# Patient Record
Sex: Female | Born: 1955 | Hispanic: No | Marital: Single | Smoking: Never smoker
Health system: Southern US, Community
[De-identification: ages and names within clinical notes are randomized; demographics above are authoritative.]

## PROBLEM LIST (undated history)

## (undated) DIAGNOSIS — I10 Essential (primary) hypertension: Secondary | ICD-10-CM

---

## 2008-01-19 ENCOUNTER — Ambulatory Visit: Payer: Self-pay | Admitting: Internal Medicine

## 2008-01-19 DIAGNOSIS — R109 Unspecified abdominal pain: Secondary | ICD-10-CM | POA: Insufficient documentation

## 2008-01-19 DIAGNOSIS — I10 Essential (primary) hypertension: Secondary | ICD-10-CM | POA: Insufficient documentation

## 2008-01-19 DIAGNOSIS — L659 Nonscarring hair loss, unspecified: Secondary | ICD-10-CM | POA: Insufficient documentation

## 2008-01-19 LAB — CONVERTED CEMR LAB
Bilirubin Urine: NEGATIVE
Blood in Urine, dipstick: NEGATIVE
Glucose, Urine, Semiquant: NEGATIVE
Nitrite: NEGATIVE
Protein, U semiquant: NEGATIVE
Specific Gravity, Urine: 1.02
Urobilinogen, UA: 0.2
pH: 6.5

## 2008-01-27 ENCOUNTER — Encounter (INDEPENDENT_AMBULATORY_CARE_PROVIDER_SITE_OTHER): Payer: Self-pay | Admitting: *Deleted

## 2008-01-27 LAB — CONVERTED CEMR LAB
BUN: 14 mg/dL (ref 6–23)
CO2: 24 meq/L (ref 19–32)
Calcium: 9.6 mg/dL (ref 8.4–10.5)
Chloride: 104 meq/L (ref 96–112)
Creatinine, Ser: 0.81 mg/dL (ref 0.40–1.20)
Glucose, Bld: 76 mg/dL (ref 70–99)
Potassium: 4.4 meq/L (ref 3.5–5.3)
Sodium: 141 meq/L (ref 135–145)
TSH: 0.783 microintl units/mL (ref 0.350–5.50)

## 2008-12-19 ENCOUNTER — Emergency Department (HOSPITAL_COMMUNITY)
Admission: EM | Admit: 2008-12-19 | Discharge: 2008-12-19 | Payer: BLUE CROSS/BLUE SHIELD | Admitting: Emergency Medicine

## 2010-08-17 ENCOUNTER — Emergency Department (HOSPITAL_COMMUNITY)
Admission: EM | Admit: 2010-08-17 | Discharge: 2010-08-17 | Payer: Self-pay | Source: Home / Self Care | Admitting: Emergency Medicine

## 2010-08-17 LAB — CBC
HCT: 37.1 % (ref 36.0–46.0)
Hemoglobin: 12.1 g/dL (ref 12.0–15.0)
MCH: 27 pg (ref 26.0–34.0)
MCHC: 32.6 g/dL (ref 30.0–36.0)
MCV: 82.8 fL (ref 78.0–100.0)
Platelets: 264 10*3/uL (ref 150–400)
RBC: 4.48 MIL/uL (ref 3.87–5.11)
RDW: 12.9 % (ref 11.5–15.5)
WBC: 9.9 10*3/uL (ref 4.0–10.5)

## 2010-08-17 LAB — COMPREHENSIVE METABOLIC PANEL
ALT: 12 U/L (ref 0–35)
AST: 17 U/L (ref 0–37)
Albumin: 3.9 g/dL (ref 3.5–5.2)
Alkaline Phosphatase: 73 U/L (ref 39–117)
BUN: 11 mg/dL (ref 6–23)
CO2: 29 mEq/L (ref 19–32)
Calcium: 9.7 mg/dL (ref 8.4–10.5)
Chloride: 105 mEq/L (ref 96–112)
Creatinine, Ser: 1 mg/dL (ref 0.4–1.2)
GFR calc Af Amer: >60 mL/min (ref >60)
GFR calc non Af Amer: 58 mL/min — ABNORMAL LOW (ref >60)
Glucose, Bld: 123 mg/dL — ABNORMAL HIGH (ref 70–99)
Potassium: 3.8 mEq/L (ref 3.5–5.1)
Sodium: 141 mEq/L (ref 135–145)
Total Bilirubin: 0.8 mg/dL (ref 0.3–1.2)
Total Protein: 7.5 g/dL (ref 6.0–8.3)

## 2010-08-17 LAB — URINALYSIS, ROUTINE W REFLEX MICROSCOPIC
Bilirubin Urine: NEGATIVE
Hemoglobin, Urine: NEGATIVE
Ketones, ur: NEGATIVE mg/dL
Nitrite: NEGATIVE
Protein, ur: NEGATIVE mg/dL
Specific Gravity, Urine: 1.014 (ref 1.005–1.030)
Urine Glucose, Fasting: NEGATIVE mg/dL
Urobilinogen, UA: 0.2 mg/dL (ref 0.0–1.0)
pH: 8 (ref 5.0–8.0)

## 2010-08-17 LAB — URINE MICROSCOPIC-ADD ON

## 2010-08-17 LAB — LIPASE, BLOOD: Lipase: 21 U/L (ref 11–59)

## 2010-09-11 NOTE — Letter (Signed)
Summary: *HSN Results Follow up  [facility]  [address] [place], [place] [address]   Phone: [phone]  Fax: [phone]      01/27/2008   Willella Bargar [address] [place], [place]  [address]   Dear  Ms. Takiya Brundidge,                            ____S.Drinkard,FNP   ____D. Gore,FNP       ____B. McPherson,MD   ____V. Rankins,MD    __xx__E. Mulberry,MD    ____N. Daphine Deutscher, FNP  ____D. Reche Dixon, MD    ____K. Philipp Deputy, MD    ____Other     This letter is to inform you that your recent test(s):  _______Pap Smear    ___x____Lab Test     _______X-ray    ___x____ is within acceptable limits  _______ requires a medication change  _______ requires a follow-up lab visit  _______ requires a follow-up visit with your provider   Comments:       _________________________________________________________ If you have any questions, please contact our office                     Sincerely,  Tiffany McCoy CMA [facility]

## 2010-09-11 NOTE — Assessment & Plan Note (Signed)
Summary: NEW PT/ FIRST EST CARE/ BP/ OUT OF PRENSCRIPTIONS//GK   Vital Signs:  Patient Profile:   55 Years Old Female Weight:      133.5 pounds Temp:     97.4 degrees F Pulse rate:   72 / minute Pulse rhythm:   regular Resp:     16 per minute BP sitting:   138 / 90  (left arm) Cuff size:   regular  Pt. in pain?   no  Vitals Entered By: Vesta Mixer CMA (January 19, 2008 10:06 AM)              Is Patient Diabetic? No  Does patient need assistance? Ambulation Normal Comments h/o total hyst.     Chief Complaint:  NP-HTN , needs refills on her meds, recently moved her and no doctors visit in 8 months, and she will soon have insurance through her job.Marland Kitchen  History of Present Illness: 55 yo female here to establish for short period--will have insurance in short while.  Concerns: 1.  Hair loss. 2.  right flank tenderness--see PE for history  PMH: 1.  Hypertension:  Previously on Lisinopril 40 mg and HCTZ 25 mg.  Has been out of meds just recently for HCTZ--still on the Lisinopril.   2.  Hx of frequent UTIs.  PSH: 1.  1999:  TAH and BSO for heavy bleeding. 2.  2000:  Kidney stones:  stone removal with uretostomy for a bit on the right      Prior Medications Reviewed Using: Medication Bottles  Current Allergies: No known allergies    Family History:    Mother, 62:  Lung cancer with bony Metastases.  Hypertension.  Cataracts.    Father, 77:  Hypercholesterolemia. Cataracts.  Pacemaker.    2 Sisters:  Hypertension.    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    Daughter lives here in [place] as well    Pt. to be driving a bus soon.   Risk Factors:  Tobacco use:  never Drug use:  yes    Substance:  cocaine    Comments:  used 6 months 19 years ago--never again.  No IV use. Alcohol use:  yes    Type:  rare Exercise:   yes    Times per week:  1    Type:  Wii Seatbelt use:  100 %    Physical Exam  General:     NAD Eyes:     No corneal or conjunctival inflammation noted. EOMI. Perrla. Funduscopic exam benign, without hemorrhages, exudates or papilledema. Vision grossly normal. Neck:     No deformities, masses, or tenderness noted. Lungs:     Normal respiratory effort, chest expands symmetrically. Lungs are clear to auscultation, no crackles or wheezes. Heart:     Normal rate and regular rhythm. S1 and S2 normal without gallop, murmur, click, rub or other extra sounds. Abdomen:     Bowel sounds positive,abdomen soft and non-tender without masses, organomegaly or hernias noted.  No abdominal bruits.  Pt. points to low right flank area of pain and tenderness at times--very superficial and quite specific area.  No mass felt--really only palpating soft tissue overlying musculature there. Pulses:     Normal and equal carotid and radial pulses. No carotid bruits.    Impression &  Recommendations:  Problem # 1:  ESSENTIAL HYPERTENSION (ICD-401.9)  Her updated medication list for this problem includes:    Lisinopril 20 Mg Tabs (Lisinopril) .Marland Kitchen... 2 by mouth once daily    Hydrochlorothiazide 25 Mg Tabs (Hydrochlorothiazide) .Marland Kitchen... 1 by mouth once daily  Orders: T-Basic Metabolic Panel [id number])   Problem # 2:  HAIR LOSS (ICD-704.00)  Orders: T-TSH ([id number])   Problem # 3:  FLANK PAIN, RIGHT (ICD-789.09) Suspect muscular pain--mild Orders: UA Dipstick w/o Micro (manual) (29562)   Complete Medication List: 1)  Lisinopril 20 Mg Tabs (Lisinopril) .... 2 by mouth once daily 2)  Hydrochlorothiazide 25 Mg Tabs (Hydrochlorothiazide) .Marland Kitchen.. 1 by mouth once daily    Prescriptions: HYDROCHLOROTHIAZIDE 25 MG  TABS (HYDROCHLOROTHIAZIDE) 1 by mouth once daily  #30 x 4   Entered and Authorized by:   Julieanne Manson MD   Signed by:   Julieanne Manson MD on 01/19/2008   Method used:   Print  then Give to Patient   RxID:   [id number] LISINOPRIL 20 MG  TABS (LISINOPRIL) 2 by mouth once daily  #60 x 4   Entered and Authorized by:   Julieanne Manson MD   Signed by:   Julieanne Manson MD on 01/19/2008   Method used:   Print then Give to Patient   RxID:   [id number]  ] Laboratory Results   Urine Tests    Routine Urinalysis   Glucose: negative   (Normal Range: Negative) Bilirubin: negative   (Normal Range: Negative) Ketone: trace (5)   (Normal Range: Negative) Spec. Gravity: 1.020   (Normal Range: 1.003-1.035) Blood: negative   (Normal Range: Negative) pH: 6.5   (Normal Range: 5.0-8.0) Protein: negative   (Normal Range: Negative) Urobilinogen: 0.2   (Normal Range: 0-1) Nitrite: negative   (Normal Range: Negative) Leukocyte Esterace: trace   (Normal Range: Negative)        Appended Document: NEW PT/ FIRST EST CARE/ BP/ OUT OF PRENSCRIPTIONS//GK call normal labs

## 2010-11-20 LAB — DIFFERENTIAL
Basophils Absolute: 0 10*3/uL (ref 0.0–0.1)
Basophils Relative: 0 % (ref 0–1)
Eosinophils Absolute: 0.2 10*3/uL (ref 0.0–0.7)
Eosinophils Relative: 2 % (ref 0–5)
Lymphocytes Relative: 21 % (ref 12–46)
Lymphs Abs: 1.6 10*3/uL (ref 0.7–4.0)
Monocytes Absolute: 0.3 10*3/uL (ref 0.1–1.0)
Monocytes Relative: 3 % (ref 3–12)
Neutro Abs: 5.8 10*3/uL (ref 1.7–7.7)
Neutrophils Relative %: 74 % (ref 43–77)

## 2010-11-20 LAB — CBC
HCT: 37.4 % (ref 36.0–46.0)
Hemoglobin: 12.5 g/dL (ref 12.0–15.0)
MCHC: 33.4 g/dL (ref 30.0–36.0)
MCV: 81.5 fL (ref 78.0–100.0)
Platelets: 261 10*3/uL (ref 150–400)
RBC: 4.59 MIL/uL (ref 3.87–5.11)
RDW: 13.6 % (ref 11.5–15.5)
WBC: 7.9 10*3/uL (ref 4.0–10.5)

## 2010-11-20 LAB — URINE MICROSCOPIC-ADD ON

## 2010-11-20 LAB — LIPASE, BLOOD: Lipase: 24 U/L (ref 11–59)

## 2010-11-20 LAB — URINALYSIS, ROUTINE W REFLEX MICROSCOPIC
Bilirubin Urine: NEGATIVE
Glucose, UA: NEGATIVE mg/dL
Ketones, ur: NEGATIVE mg/dL
Leukocytes, UA: NEGATIVE
Nitrite: NEGATIVE
Protein, ur: NEGATIVE mg/dL
Specific Gravity, Urine: 1.015 (ref 1.005–1.030)
Urobilinogen, UA: 0.2 mg/dL (ref 0.0–1.0)
pH: 7.5 (ref 5.0–8.0)

## 2010-11-20 LAB — COMPREHENSIVE METABOLIC PANEL
ALT: <8 U/L (ref 0–35)
AST: 23 U/L (ref 0–37)
Albumin: 4.1 g/dL (ref 3.5–5.2)
Alkaline Phosphatase: 82 U/L (ref 39–117)
BUN: 20 mg/dL (ref 6–23)
CO2: 24 mEq/L (ref 19–32)
Calcium: 9.5 mg/dL (ref 8.4–10.5)
Chloride: 106 mEq/L (ref 96–112)
Creatinine, Ser: 0.98 mg/dL (ref 0.4–1.2)
GFR calc Af Amer: >60 mL/min (ref >60)
GFR calc non Af Amer: 60 mL/min — ABNORMAL LOW (ref >60)
Glucose, Bld: 139 mg/dL — ABNORMAL HIGH (ref 70–99)
Potassium: 4.2 mEq/L (ref 3.5–5.1)
Sodium: 139 mEq/L (ref 135–145)
Total Bilirubin: 0.8 mg/dL (ref 0.3–1.2)
Total Protein: 8.1 g/dL (ref 6.0–8.3)

## 2012-04-24 ENCOUNTER — Other Ambulatory Visit: Payer: Self-pay | Admitting: Obstetrics and Gynecology

## 2012-04-24 DIAGNOSIS — Z1231 Encounter for screening mammogram for malignant neoplasm of breast: Secondary | ICD-10-CM

## 2012-05-14 ENCOUNTER — Ambulatory Visit
Admission: RE | Admit: 2012-05-14 | Discharge: 2012-05-14 | Disposition: A | Discharge status: Home / Self Care | Payer: BC Managed Care – PPO | Source: Ambulatory Visit | Attending: Obstetrics and Gynecology | Admitting: Obstetrics and Gynecology

## 2012-05-14 DIAGNOSIS — Z1231 Encounter for screening mammogram for malignant neoplasm of breast: Secondary | ICD-10-CM

## 2015-01-05 ENCOUNTER — Other Ambulatory Visit: Payer: Self-pay | Admitting: Family Medicine

## 2015-01-05 DIAGNOSIS — Z1231 Encounter for screening mammogram for malignant neoplasm of breast: Secondary | ICD-10-CM

## 2015-01-12 ENCOUNTER — Ambulatory Visit
Admission: RE | Admit: 2015-01-12 | Discharge: 2015-01-12 | Disposition: A | Discharge status: Home / Self Care | Payer: BLUE CROSS/BLUE SHIELD | Source: Ambulatory Visit | Attending: Family Medicine | Admitting: Family Medicine

## 2015-01-12 DIAGNOSIS — Z1231 Encounter for screening mammogram for malignant neoplasm of breast: Secondary | ICD-10-CM

## 2015-10-21 ENCOUNTER — Emergency Department (HOSPITAL_COMMUNITY): Payer: BLUE CROSS/BLUE SHIELD

## 2015-10-21 ENCOUNTER — Emergency Department (HOSPITAL_COMMUNITY)
Admission: EM | Admit: 2015-10-21 | Discharge: 2015-10-21 | Disposition: A | Discharge status: Home / Self Care | Payer: BLUE CROSS/BLUE SHIELD | Attending: Emergency Medicine | Admitting: Emergency Medicine

## 2015-10-21 ENCOUNTER — Encounter (HOSPITAL_COMMUNITY): Payer: Self-pay | Admitting: *Deleted

## 2015-10-21 DIAGNOSIS — R079 Chest pain, unspecified: Secondary | ICD-10-CM | POA: Insufficient documentation

## 2015-10-21 DIAGNOSIS — I10 Essential (primary) hypertension: Secondary | ICD-10-CM | POA: Insufficient documentation

## 2015-10-21 HISTORY — DX: Essential (primary) hypertension: I10

## 2015-10-21 LAB — CBC
HCT: 37.1 % (ref 36.0–46.0)
Hemoglobin: 12 g/dL (ref 12.0–15.0)
MCH: 27 pg (ref 26.0–34.0)
MCHC: 32.3 g/dL (ref 30.0–36.0)
MCV: 83.4 fL (ref 78.0–100.0)
Platelets: 300 10*3/uL (ref 150–400)
RBC: 4.45 MIL/uL (ref 3.87–5.11)
RDW: 13.2 % (ref 11.5–15.5)
WBC: 8.2 10*3/uL (ref 4.0–10.5)

## 2015-10-21 LAB — BASIC METABOLIC PANEL
Anion gap: 9 (ref 5–15)
BUN: 7 mg/dL (ref 6–20)
CO2: 27 mmol/L (ref 22–32)
Calcium: 9.4 mg/dL (ref 8.9–10.3)
Chloride: 105 mmol/L (ref 101–111)
Creatinine, Ser: 0.85 mg/dL (ref 0.44–1.00)
GFR calc Af Amer: >60 mL/min (ref >60)
GFR calc non Af Amer: >60 mL/min (ref >60)
Glucose, Bld: 118 mg/dL — ABNORMAL HIGH (ref 65–99)
Potassium: 3.9 mmol/L (ref 3.5–5.1)
Sodium: 141 mmol/L (ref 135–145)

## 2015-10-21 LAB — TROPONIN I: Troponin I: <0.03 ng/mL (ref <0.031)

## 2015-10-21 MED ORDER — GI COCKTAIL ~~LOC~~
30.0000 mL | Freq: Once | ORAL | Status: AC
Start: 2015-10-21 — End: 2015-10-21
  Administered 2015-10-21: 30 mL via ORAL
  Filled 2015-10-21: qty 30

## 2015-10-21 MED ORDER — OMEPRAZOLE 20 MG PO CPDR: 20.0000 mg | DELAYED_RELEASE_CAPSULE | Freq: Two times a day (BID) | ORAL | Status: DC

## 2015-10-21 NOTE — ED Notes (Signed)
The pt is c/o chest pain i triaged her up front no change in her chest pain

## 2015-10-21 NOTE — Discharge Instructions (Signed)
Nonspecific Chest Pain  °Chest pain can be caused by many different conditions. There is always a chance that your pain could be related to something serious, such as a heart attack or a blood clot in your lungs. Chest pain can also be caused by conditions that are not life-threatening. If you have chest pain, it is very important to follow up with your health care provider. °CAUSES  °Chest pain can be caused by: °· Heartburn. °· Pneumonia or bronchitis. °· Anxiety or stress. °· Inflammation around your heart (pericarditis) or lung (pleuritis or pleurisy). °· A blood clot in your lung. °· A collapsed lung (pneumothorax). It can develop suddenly on its own (spontaneous pneumothorax) or from trauma to the chest. °· Shingles infection (varicella-zoster virus). °· Heart attack. °· Damage to the bones, muscles, and cartilage that make up your chest wall. This can include: °· Bruised bones due to injury. °· Strained muscles or cartilage due to frequent or repeated coughing or overwork. °· Fracture to one or more ribs. °· Sore cartilage due to inflammation (costochondritis). °RISK FACTORS  °Risk factors for chest pain may include: °· Activities that increase your risk for trauma or injury to your chest. °· Respiratory infections or conditions that cause frequent coughing. °· Medical conditions or overeating that can cause heartburn. °· Heart disease or family history of heart disease. °· Conditions or health behaviors that increase your risk of developing a blood clot. °· Having had chicken pox (varicella zoster). °SIGNS AND SYMPTOMS °Chest pain can feel like: °· Burning or tingling on the surface of your chest or deep in your chest. °· Crushing, pressure, aching, or squeezing pain. °· Dull or sharp pain that is worse when you move, cough, or take a deep breath. °· Pain that is also felt in your back, neck, shoulder, or arm, or pain that spreads to any of these areas. °Your chest pain may come and go, or it may stay  constant. °DIAGNOSIS °Lab tests or other studies may be needed to find the cause of your pain. Your health care provider may have you take a test called an ambulatory ECG (electrocardiogram). An ECG records your heartbeat patterns at the time the test is performed. You may also have other tests, such as: °· Transthoracic echocardiogram (TTE). During echocardiography, sound waves are used to create a picture of all of the heart structures and to look at how blood flows through your heart. °· Transesophageal echocardiogram (TEE). This is a more advanced imaging test that obtains images from inside your body. It allows your health care provider to see your heart in finer detail. °· Cardiac monitoring. This allows your health care provider to monitor your heart rate and rhythm in real time. °· Holter monitor. This is a portable device that records your heartbeat and can help to diagnose abnormal heartbeats. It allows your health care provider to track your heart activity for several days, if needed. °· Stress tests. These can be done through exercise or by taking medicine that makes your heart beat more quickly. °· Blood tests. °· Imaging tests. °TREATMENT  °Your treatment depends on what is causing your chest pain. Treatment may include: °· Medicines. These may include: °· Acid blockers for heartburn. °· Anti-inflammatory medicine. °· Pain medicine for inflammatory conditions. °· Antibiotic medicine, if an infection is present. °· Medicines to dissolve blood clots. °· Medicines to treat coronary artery disease. °· Supportive care for conditions that do not require medicines. This may include: °· Resting. °· Applying heat   or cold packs to injured areas. °· Limiting activities until pain decreases. °HOME CARE INSTRUCTIONS °· If you were prescribed an antibiotic medicine, finish it all even if you start to feel better. °· Avoid any activities that bring on chest pain. °· Do not use any tobacco products, including  cigarettes, chewing tobacco, or electronic cigarettes. If you need help quitting, ask your health care provider. °· Do not drink alcohol. °· Take medicines only as directed by your health care provider. °· Keep all follow-up visits as directed by your health care provider. This is important. This includes any further testing if your chest pain does not go away. °· If heartburn is the cause for your chest pain, you may be told to keep your head raised (elevated) while sleeping. This reduces the chance that acid will go from your stomach into your esophagus. °· Make lifestyle changes as directed by your health care provider. These may include: °¨ Getting regular exercise. Ask your health care provider to suggest some activities that are safe for you. °¨ Eating a heart-healthy diet. A registered dietitian can help you to learn healthy eating options. °¨ Maintaining a healthy weight. °¨ Managing diabetes, if necessary. °¨ Reducing stress. °SEEK MEDICAL CARE IF: °· Your chest pain does not go away after treatment. °· You have a rash with blisters on your chest. °· You have a fever. °SEEK IMMEDIATE MEDICAL CARE IF:  °· Your chest pain is worse. °· You have an increasing cough, or you cough up blood. °· You have severe abdominal pain. °· You have severe weakness. °· You faint. °· You have chills. °· You have sudden, unexplained chest discomfort. °· You have sudden, unexplained discomfort in your arms, back, neck, or jaw. °· You have shortness of breath at any time. °· You suddenly start to sweat, or your skin gets clammy. °· You feel nauseous or you vomit. °· You suddenly feel light-headed or dizzy. °· Your heart begins to beat quickly, or it feels like it is skipping beats. °These symptoms may represent a serious problem that is an emergency. Do not wait to see if the symptoms will go away. Get medical help right away. Call your local emergency services (911 in the [place] Do not drive yourself to the hospital. °  °This  information is not intended to replace advice given to you by your health care provider. Make sure you discuss any questions you have with your health care provider. °  °Document Released: 05/08/2005 Document Revised: 08/19/2014 Document Reviewed: 03/04/2014 °Elsevier Interactive Patient Education ©2016 [employer] ° °Gastroesophageal Reflux Disease, Adult °Normally, food travels down the esophagus and stays in the stomach to be digested. However, when a person has gastroesophageal reflux disease (GERD), food and stomach acid move back up into the esophagus. When this happens, the esophagus becomes sore and inflamed. Over time, GERD can create small holes (ulcers) in the lining of the esophagus.  °CAUSES °This condition is caused by a problem with the muscle between the esophagus and the stomach (lower esophageal sphincter, or LES). Normally, the LES muscle closes after food passes through the esophagus to the stomach. When the LES is weakened or abnormal, it does not close properly, and that allows food and stomach acid to go back up into the esophagus. The LES can be weakened by certain dietary substances, medicines, and medical conditions, including: °· Tobacco use. °· Pregnancy. °· Having a hiatal hernia. °· Heavy alcohol use. °· Certain foods and beverages, such as coffee,   chocolate, onions, and peppermint. °RISK FACTORS °This condition is more likely to develop in: °· People who have an increased body weight. °· People who have connective tissue disorders. °· People who use NSAID medicines. °SYMPTOMS °Symptoms of this condition include: °· Heartburn. °· Difficult or painful swallowing. °· The feeling of having a lump in the throat. °· A bitter taste in the mouth. °· Bad breath. °· Having a large amount of saliva. °· Having an upset or bloated stomach. °· Belching. °· Chest pain. °· Shortness of breath or wheezing. °· Ongoing (chronic) cough or a night-time cough. °· Wearing away of tooth enamel. °· Weight  loss. °Different conditions can cause chest pain. Make sure to see your health care provider if you experience chest pain. °DIAGNOSIS °Your health care provider will take a medical history and perform a physical exam. To determine if you have mild or severe GERD, your health care provider may also monitor how you respond to treatment. You may also have other tests, including: °· An endoscopy to examine your stomach and esophagus with a small camera. °· A test that measures the acidity level in your esophagus. °· A test that measures how much pressure is on your esophagus. °· A barium swallow or modified barium swallow to show the shape, size, and functioning of your esophagus. °TREATMENT °The goal of treatment is to help relieve your symptoms and to prevent complications. Treatment for this condition may vary depending on how severe your symptoms are. Your health care provider may recommend: °· Changes to your diet. °· Medicine. °· Surgery. °HOME CARE INSTRUCTIONS °Diet °· Follow a diet as recommended by your health care provider. This may involve avoiding foods and drinks such as: °¨ Coffee and tea (with or without caffeine). °¨ Drinks that contain alcohol. °¨ Energy drinks and sports drinks. °¨ Carbonated drinks or sodas. °¨ Chocolate and cocoa. °¨ Peppermint and mint flavorings. °¨ Garlic and onions. °¨ Horseradish. °¨ Spicy and acidic foods, including peppers, chili powder, curry powder, vinegar, hot sauces, and barbecue sauce. °¨ Citrus fruit juices and citrus fruits, such as oranges, lemons, and limes. °¨ Tomato-based foods, such as red sauce, chili, salsa, and pizza with red sauce. °¨ Fried and fatty foods, such as donuts, french fries, potato chips, and high-fat dressings. °¨ High-fat meats, such as hot dogs and fatty cuts of red and white meats, such as rib eye steak, sausage, ham, and bacon. °¨ High-fat dairy items, such as whole milk, butter, and cream cheese. °· Eat small, frequent meals instead of large  meals. °· Avoid drinking large amounts of liquid with your meals. °· Avoid eating meals during the 2-3 hours before bedtime. °· Avoid lying down right after you eat. °· Do not exercise right after you eat. ° General Instructions  °· Pay attention to any changes in your symptoms. °· Take over-the-counter and prescription medicines only as told by your health care provider. Do not take aspirin, ibuprofen, or other NSAIDs unless your health care provider told you to do so. °· Do not use any tobacco products, including cigarettes, chewing tobacco, and e-cigarettes. If you need help quitting, ask your health care provider. °· Wear loose-fitting clothing. Do not wear anything tight around your waist that causes pressure on your abdomen. °· Raise (elevate) the head of your bed 6 inches (15cm). °· Try to reduce your stress, such as with yoga or meditation. If you need help reducing stress, ask your health care provider. °· If you are overweight, reduce your weight to an amount that is   healthy for you. Ask your health care provider for guidance about a safe weight loss goal. °· Keep all follow-up visits as told by your health care provider. This is important. °SEEK MEDICAL CARE IF: °· You have new symptoms. °· You have unexplained weight loss. °· You have difficulty swallowing, or it hurts to swallow. °· You have wheezing or a persistent cough. °· Your symptoms do not improve with treatment. °· You have a hoarse voice. °SEEK IMMEDIATE MEDICAL CARE IF: °· You have pain in your arms, neck, jaw, teeth, or back. °· You feel sweaty, dizzy, or light-headed. °· You have chest pain or shortness of breath. °· You vomit and your vomit looks like blood or coffee grounds. °· You faint. °· Your stool is bloody or black. °· You cannot swallow, drink, or eat. °  °This information is not intended to replace advice given to you by your health care provider. Make sure you discuss any questions you have with your health care provider. °    °Document Released: 05/08/2005 Document Revised: 04/19/2015 Document Reviewed: 11/23/2014 °Elsevier Interactive Patient Education ©2016 Elsevier Inc. ° °

## 2015-10-21 NOTE — ED Provider Notes (Signed)
CSN: [id number]     Arrival date & time 10/21/15  0026 History  By signing my name below, I, Doreatha Martin, attest that this documentation has been prepared under the direction and in the presence of Azalia Bilis, MD. Electronically Signed: Doreatha Martin, ED Scribe. 10/21/2015. 3:51 AM.    Chief Complaint  Patient presents with  . Chest Pain   The history is provided by the patient. No language interpreter was used.   HPI Comments: Melissa Hays is a 60 y.o. female with h/o HTN who presents to the Emergency Department complaining of moderate, persistent, constant CP and pressure onset this morning. Pt states associated shoulder pain onset this morning, neck pain, nausea, dorsal throat pain. She states she took her BP medications as scheduled this morning before the onset of pain. Pt states her pain persisted until 1 pm when she called her doctor and persisted again until 3 pm. She states that she then took ASA and used a heating pad with no relief of pain. She reports that deep breathing exacerbates her chest and throat pain. Pt notes that her nausea has currently subsided. No h/o HLD, DM. Pt is a non-smoker. No FHx heart disease. No FHx of CAD/CHF<55yo. PCP is with [facility]. She denies emesis, diarrhea.   Past Medical History  Diagnosis Date  . Hypertension    History reviewed. No pertinent past surgical history. No family history on file. Social History  Substance Use Topics  . Smoking status: Never Smoker   . Smokeless tobacco: None  . Alcohol Use: Yes   OB History    No data available     Review of Systems A complete 10 system review of systems was obtained and all systems are negative except as noted in the HPI and PMH.    Allergies  Review of patient's allergies indicates no known allergies.  Home Medications   Prior to Admission medications   Not on File   BP 133/101 mmHg  Pulse 88  Temp(Src) 98.8 F (37.1 C) (Oral)  Resp 20  Ht 5' (1.524 m)  Wt 149 lb 7 oz  (67.784 kg)  BMI 29.18 kg/m2  SpO2 97% Physical Exam  Constitutional: She is oriented to person, place, and time. She appears well-developed and well-nourished. No distress.  HENT:  Head: Normocephalic and atraumatic.  Eyes: EOM are normal.  Neck: Normal range of motion.  Cardiovascular: Normal rate, regular rhythm and normal heart sounds.  Exam reveals no gallop and no friction rub.   No murmur heard. Pulmonary/Chest: Effort normal and breath sounds normal. No respiratory distress. She has no wheezes. She has no rales.  Abdominal: Soft. She exhibits no distension. There is no tenderness.  Musculoskeletal: Normal range of motion.  Neurological: She is alert and oriented to person, place, and time.  Skin: Skin is warm and dry.  Psychiatric: She has a normal mood and affect. Judgment normal.  Nursing note and vitals reviewed.   ED Course  Procedures (including critical care time) DIAGNOSTIC STUDIES: Oxygen Saturation is 97% on RA, normal by my interpretation.    COORDINATION OF CARE: 3:49 AM Discussed treatment plan with pt at bedside which includes lab work, CXR and pt agreed to plan.   Labs Review Labs Reviewed  BASIC METABOLIC PANEL - Abnormal; Notable for the following:    Glucose, Bld 118 (*)    All other components within normal limits  CBC  TROPONIN I    Imaging Review Dg Chest 2 View  10/21/2015  CLINICAL DATA:  Substernal chest pain EXAM: CHEST  2 VIEW COMPARISON:  None. FINDINGS: Normal heart size and mediastinal contours. Minimal atelectasis. No acute infiltrate or edema. No effusion or pneumothorax. At least moderate mid thoracic compression deformity with margins poorly evaluated due to overlapping tissues. Fracture is age indeterminate by imaging. IMPRESSION: 1. Minimal atelectasis.  No pneumonia or edema. 2. Mid thoracic compression fracture. Electronically Signed   By: Marnee SpringJonathon  Watts M.D.   On: 10/21/2015 02:04   I have personally reviewed and evaluated these  images and lab results as part of my medical decision-making.   EKG Interpretation   Date/Time:  Saturday October 21 2015 00:30:29 EST Ventricular Rate:  90 PR Interval:  194 QRS Duration: 84 QT Interval:  322 QTC Calculation: 393 R Axis:   39 Text Interpretation:  Normal sinus rhythm Nonspecific T wave abnormality  Abnormal ECG No old tracing to compare Confirmed by Hasana Alcorta  MD, Jacorian Golaszewski  ([id number]) on 10/21/2015 3:49:04 AM      MDM   Final diagnoses:  Chest pain, unspecified chest pain type    Chest pain.  Doubt ACS.  Constant pain.  EKG without ischemic changes.  Troponin negative.  Doubt PE.  Could represent pericarditis versus Esophageal reflux disease.  Improved with GI cocktail.  Discharge home with anti-inflammatories and Prilosec.  Primary care and GI follow-up.  She understands to return to the ER for new or worsening symptoms  I personally performed the services described in this documentation, which was scribed in my presence. The recorded information has been reviewed and is accurate.       Azalia BilisKevin Zanyia Silbaugh, MD 10/21/15 [phone]

## 2015-10-21 NOTE — ED Notes (Signed)
The pt is c/o mid chest pain since this am with some sl sob  None now.  Describes it as a heaviness.

## 2016-01-13 ENCOUNTER — Emergency Department (HOSPITAL_COMMUNITY)
Admission: EM | Admit: 2016-01-13 | Discharge: 2016-01-13 | Disposition: A | Discharge status: Home / Self Care | Payer: BLUE CROSS/BLUE SHIELD | Attending: Emergency Medicine | Admitting: Emergency Medicine

## 2016-01-13 ENCOUNTER — Encounter (HOSPITAL_COMMUNITY): Payer: Self-pay | Admitting: Emergency Medicine

## 2016-01-13 DIAGNOSIS — Y999 Unspecified external cause status: Secondary | ICD-10-CM | POA: Diagnosis not present

## 2016-01-13 DIAGNOSIS — I10 Essential (primary) hypertension: Secondary | ICD-10-CM | POA: Diagnosis not present

## 2016-01-13 DIAGNOSIS — Y9289 Other specified places as the place of occurrence of the external cause: Secondary | ICD-10-CM | POA: Insufficient documentation

## 2016-01-13 DIAGNOSIS — S59902A Unspecified injury of left elbow, initial encounter: Secondary | ICD-10-CM | POA: Diagnosis not present

## 2016-01-13 DIAGNOSIS — W010XXA Fall on same level from slipping, tripping and stumbling without subsequent striking against object, initial encounter: Secondary | ICD-10-CM | POA: Insufficient documentation

## 2016-01-13 DIAGNOSIS — Y939 Activity, unspecified: Secondary | ICD-10-CM | POA: Insufficient documentation

## 2016-01-13 DIAGNOSIS — S6991XA Unspecified injury of right wrist, hand and finger(s), initial encounter: Secondary | ICD-10-CM | POA: Diagnosis not present

## 2016-01-13 DIAGNOSIS — W19XXXA Unspecified fall, initial encounter: Secondary | ICD-10-CM

## 2016-01-13 DIAGNOSIS — M7918 Myalgia, other site: Secondary | ICD-10-CM

## 2016-01-13 NOTE — Discharge Instructions (Signed)
Read the information below.  You may return to the Emergency Department at any time for worsening condition or any new symptoms that concern you.  If you develop uncontrolled pain, weakness or numbness of the extremity, or severe discoloration of the skin return to the ER for a recheck.      Musculoskeletal Pain Musculoskeletal pain is muscle and boney aches and pains. These pains can occur in any part of the body. Your caregiver may treat you without knowing the cause of the pain. They may treat you if blood or urine tests, X-rays, and other tests were normal.  CAUSES There is often not a definite cause or reason for these pains. These pains may be caused by a type of germ (virus). The discomfort may also come from overuse. Overuse includes working out too hard when your body is not fit. Boney aches also come from weather changes. Bone is sensitive to atmospheric pressure changes. HOME CARE INSTRUCTIONS   Ask when your test results will be ready. Make sure you get your test results.  Only take over-the-counter or prescription medicines for pain, discomfort, or fever as directed by your caregiver. If you were given medications for your condition, do not drive, operate machinery or power tools, or sign legal documents for 24 hours. Do not drink alcohol. Do not take sleeping pills or other medications that may interfere with treatment.  Continue all activities unless the activities cause more pain. When the pain lessens, slowly resume normal activities. Gradually increase the intensity and duration of the activities or exercise.  During periods of severe pain, bed rest may be helpful. Lay or sit in any position that is comfortable.  Putting ice on the injured area.  Put ice in a bag.  Place a towel between your skin and the bag.  Leave the ice on for 15 to 20 minutes, 3 to 4 times a day.  Follow up with your caregiver for continued problems and no reason can be found for the pain. If the pain  becomes worse or does not go away, it may be necessary to repeat tests or do additional testing. Your caregiver may need to look further for a possible cause. SEEK IMMEDIATE MEDICAL CARE IF:  You have pain that is getting worse and is not relieved by medications.  You develop chest pain that is associated with shortness or breath, sweating, feeling sick to your stomach (nauseous), or throw up (vomit).  Your pain becomes localized to the abdomen.  You develop any new symptoms that seem different or that concern you. MAKE SURE YOU:   Understand these instructions.  Will watch your condition.  Will get help right away if you are not doing well or get worse.   This information is not intended to replace advice given to you by your health care provider. Make sure you discuss any questions you have with your health care provider.   Document Released: 07/29/2005 Document Revised: 10/21/2011 Document Reviewed: 04/02/2013 Elsevier Interactive Patient Education [employer]2016 [employer] Inc.

## 2016-01-13 NOTE — ED Provider Notes (Signed)
CSN: [id number]     Arrival date & time 01/13/16  1514 History  By signing my name below, I, Tanda Rockers, attest that this documentation has been prepared under the direction and in the presence of [facility], PA-C. Electronically Signed: Tanda Rockers, ED Scribe. 01/13/2016. 5:03 PM.   Chief Complaint  Patient presents with  . Fall   The history is provided by the patient. No language interpreter was used.     HPI Comments: Melissa Hays is a 60 y.o. female who presents to the Emergency Department complaining of gradual onset, constant, right palm pain and left elbow pain s/p fall that occurred earlier today. Pt was in [employer] when she slipped on water and fell backwards. Pt attempted to catch herself with her outstretched hands, causing pain to the palm of her right hand. She believes that she slapped her hand too hard onto the ground. Pt also attributes her elbow pain to bruising and does not feel like she needs x rays at this time. Her elbow hit a stack of bricks.  No head injury or LOC. Denies weakness, numbness, tingling, chest pain, shortness of breath, abdominal pain, leg pain, hip pain, headache, or any other associated symptoms.  Denies any other significant pain at this time.    Past Medical History  Diagnosis Date  . Hypertension    History reviewed. No pertinent past surgical history. No family history on file. Social History  Substance Use Topics  . Smoking status: Never Smoker   . Smokeless tobacco: None  . Alcohol Use: Yes   OB History    No data available     Review of Systems  Constitutional: Negative for diaphoresis, activity change and appetite change.  Respiratory: Negative for shortness of breath.   Cardiovascular: Negative for chest pain.  Gastrointestinal: Negative for abdominal pain.  Musculoskeletal: Positive for arthralgias. Negative for myalgias, back pain and neck pain.  Skin: Negative for wound.  Allergic/Immunologic: Negative for  immunocompromised state.  Neurological: Negative for syncope, weakness, numbness and headaches.  Hematological: Does not bruise/bleed easily.  Psychiatric/Behavioral: Negative for self-injury.   Allergies  Review of patient's allergies indicates no known allergies.  Home Medications   Prior to Admission medications   Medication Sig Start Date End Date Taking? Authorizing Provider  omeprazole (PRILOSEC) 20 MG capsule Take 1 capsule (20 mg total) by mouth 2 (two) times daily before a meal. 10/21/15   Azalia Bilis, MD   BP 120/83 mmHg  Pulse 72  Temp(Src) 98.4 F (36.9 C) (Oral)  Resp 16  Ht 5' (1.524 m)  Wt 145 lb (65.772 kg)  BMI 28.32 kg/m2  SpO2 98%   Physical Exam  Constitutional: She appears well-developed and well-nourished. No distress.  HENT:  Head: Normocephalic and atraumatic.  Neck: Neck supple.  Pulmonary/Chest: Effort normal.  Abdominal: Soft. There is no tenderness.  No focal tenderness on exam  Musculoskeletal:  Upper extremities:  Strength 5/5, sensation intact, distal pulses intact.   Spine nontender, no crepitus, or stepoffs.  Neurological: She is alert.  Skin: She is not diaphoretic.  Nursing note and vitals reviewed.   ED Course  Procedures (including critical care time)  DIAGNOSTIC STUDIES: Oxygen Saturation is 98% on RA, normal by my interpretation.    COORDINATION OF CARE: 5:02 PM-Discussed treatment plan with pt at bedside and pt agreed to plan.    MDM   Final diagnoses:  Fall, initial encounter  Musculoskeletal pain   Engaged in joint medical decision making with  the patient who declined x rays at this time.  Afebrile, nontoxic patient with injury to her right palm and left elbow from mechanical fall (slipped on water).   Xray not currently indicated.  NO focal tenderness.  Neurovascularly intact.   D/C home with PCP follow up PRN.  Discussed result, findings, treatment, and follow up  with patient.  Pt given return precautions.  Pt  verbalizes understanding and agrees with plan.       I personally performed the services described in this documentation, which was scribed in my presence. The recorded information has been reviewed and is accurate.     Trixie Dredgemily Giordana Weinheimer, PA-C 01/13/16 1814  Lorre NickAnthony Allen, MD 01/14/16 2253
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
## 2016-01-13 NOTE — ED Notes (Signed)
Pt reports left elbow, right hand, bilateral ribcage, right hip, and right leg pain post fall in [employer].

## 2016-07-18 ENCOUNTER — Encounter (HOSPITAL_COMMUNITY): Payer: Self-pay | Admitting: *Deleted

## 2016-07-18 ENCOUNTER — Emergency Department (HOSPITAL_COMMUNITY)
Admission: EM | Admit: 2016-07-18 | Discharge: 2016-07-18 | Disposition: A | Discharge status: Home / Self Care | Payer: BLUE CROSS/BLUE SHIELD | Attending: Emergency Medicine | Admitting: Emergency Medicine

## 2016-07-18 ENCOUNTER — Emergency Department (HOSPITAL_COMMUNITY): Payer: BLUE CROSS/BLUE SHIELD

## 2016-07-18 DIAGNOSIS — N23 Unspecified renal colic: Secondary | ICD-10-CM

## 2016-07-18 DIAGNOSIS — I10 Essential (primary) hypertension: Secondary | ICD-10-CM | POA: Insufficient documentation

## 2016-07-18 DIAGNOSIS — I7102 Dissection of abdominal aorta: Secondary | ICD-10-CM

## 2016-07-18 DIAGNOSIS — R350 Frequency of micturition: Secondary | ICD-10-CM | POA: Diagnosis present

## 2016-07-18 LAB — CBC WITH DIFFERENTIAL/PLATELET
Basophils Absolute: 0 10*3/uL (ref 0.0–0.1)
Basophils Relative: 1 %
Eosinophils Absolute: 0.2 10*3/uL (ref 0.0–0.7)
Eosinophils Relative: 3 %
HCT: 37.4 % (ref 36.0–46.0)
Hemoglobin: 12.1 g/dL (ref 12.0–15.0)
Lymphocytes Relative: 31 %
Lymphs Abs: 2 10*3/uL (ref 0.7–4.0)
MCH: 26.5 pg (ref 26.0–34.0)
MCHC: 32.4 g/dL (ref 30.0–36.0)
MCV: 82 fL (ref 78.0–100.0)
Monocytes Absolute: 0.3 10*3/uL (ref 0.1–1.0)
Monocytes Relative: 4 %
Neutro Abs: 4 10*3/uL (ref 1.7–7.7)
Neutrophils Relative %: 61 %
Platelets: 304 10*3/uL (ref 150–400)
RBC: 4.56 MIL/uL (ref 3.87–5.11)
RDW: 13.3 % (ref 11.5–15.5)
WBC: 6.4 10*3/uL (ref 4.0–10.5)

## 2016-07-18 LAB — COMPREHENSIVE METABOLIC PANEL
ALT: 12 U/L — ABNORMAL LOW (ref 14–54)
AST: 21 U/L (ref 15–41)
Albumin: 4.2 g/dL (ref 3.5–5.0)
Alkaline Phosphatase: 72 U/L (ref 38–126)
Anion gap: 10 (ref 5–15)
BUN: 10 mg/dL (ref 6–20)
CO2: 23 mmol/L (ref 22–32)
Calcium: 9.4 mg/dL (ref 8.9–10.3)
Chloride: 104 mmol/L (ref 101–111)
Creatinine, Ser: 0.98 mg/dL (ref 0.44–1.00)
GFR calc Af Amer: >60 mL/min (ref >60)
GFR calc non Af Amer: >60 mL/min (ref >60)
Glucose, Bld: 149 mg/dL — ABNORMAL HIGH (ref 65–99)
Potassium: 3.4 mmol/L — ABNORMAL LOW (ref 3.5–5.1)
Sodium: 137 mmol/L (ref 135–145)
Total Bilirubin: 0.7 mg/dL (ref 0.3–1.2)
Total Protein: 7.9 g/dL (ref 6.5–8.1)

## 2016-07-18 LAB — URINALYSIS, ROUTINE W REFLEX MICROSCOPIC
Bilirubin Urine: NEGATIVE
Glucose, UA: NEGATIVE mg/dL
Hgb urine dipstick: NEGATIVE
Ketones, ur: NEGATIVE mg/dL
Leukocytes, UA: NEGATIVE
Nitrite: NEGATIVE
Protein, ur: NEGATIVE mg/dL
Specific Gravity, Urine: 1.008 (ref 1.005–1.030)
pH: 9 — ABNORMAL HIGH (ref 5.0–8.0)

## 2016-07-18 LAB — WET PREP, GENITAL
Sperm: NONE SEEN
Trich, Wet Prep: NONE SEEN
Yeast Wet Prep HPF POC: NONE SEEN

## 2016-07-18 LAB — I-STAT CHEM 8, ED
BUN: 11 mg/dL (ref 6–20)
Calcium, Ion: 1.18 mmol/L (ref 1.15–1.40)
Chloride: 104 mmol/L (ref 101–111)
Creatinine, Ser: 0.8 mg/dL (ref 0.44–1.00)
Glucose, Bld: 151 mg/dL — ABNORMAL HIGH (ref 65–99)
HCT: 38 % (ref 36.0–46.0)
Hemoglobin: 12.9 g/dL (ref 12.0–15.0)
Potassium: 3.5 mmol/L (ref 3.5–5.1)
Sodium: 140 mmol/L (ref 135–145)
TCO2: 24 mmol/L (ref 0–100)

## 2016-07-18 LAB — POC URINE PREG, ED: Preg Test, Ur: NEGATIVE

## 2016-07-18 MED ORDER — IOPAMIDOL (ISOVUE-300) INJECTION 61%
INTRAVENOUS | Status: AC
  Administered 2016-07-18: 100 mL
  Filled 2016-07-18: qty 100

## 2016-07-18 MED ORDER — ONDANSETRON 4 MG PO TBDP: 4.0000 mg | ORAL_TABLET | Freq: Three times a day (TID) | ORAL | 0 refills | Status: DC | PRN

## 2016-07-18 MED ORDER — TRAMADOL HCL 50 MG PO TABS
50.0000 mg | ORAL_TABLET | Freq: Once | ORAL | Status: AC
  Administered 2016-07-18: 50 mg via ORAL
  Filled 2016-07-18: qty 1

## 2016-07-18 MED ORDER — SODIUM CHLORIDE 0.9 % IV BOLUS (SEPSIS)
1000.0000 mL | Freq: Once | INTRAVENOUS | Status: AC
  Administered 2016-07-18: 1000 mL via INTRAVENOUS

## 2016-07-18 MED ORDER — HYDROCODONE-ACETAMINOPHEN 5-325 MG PO TABS: 1.0000 | ORAL_TABLET | Freq: Four times a day (QID) | ORAL | 0 refills | Status: DC | PRN

## 2016-07-18 MED ORDER — ONDANSETRON 4 MG PO TBDP
4.0000 mg | ORAL_TABLET | Freq: Once | ORAL | Status: AC
  Administered 2016-07-18: 4 mg via ORAL
  Filled 2016-07-18: qty 1

## 2016-07-18 MED ORDER — IBUPROFEN 400 MG PO TABS
600.0000 mg | ORAL_TABLET | Freq: Once | ORAL | Status: AC
  Administered 2016-07-18: 600 mg via ORAL
  Filled 2016-07-18: qty 1

## 2016-07-18 MED ORDER — MORPHINE SULFATE (PF) 4 MG/ML IV SOLN
4.0000 mg | Freq: Once | INTRAVENOUS | Status: AC
  Administered 2016-07-18: 4 mg via INTRAVENOUS
  Filled 2016-07-18: qty 1

## 2016-07-18 NOTE — ED Triage Notes (Signed)
Pt c/o urinary frequency with constant pressure in vagina x 2 weeks

## 2016-07-18 NOTE — Discharge Instructions (Signed)
We saw you in the ER for the abdominal pain. Our results indicate that you have a kidney stone. We were able to get your pain is relative control, and we can safely send you home.  Take the meds prescribed. Set up an appointment with the Urologist. If the pain is unbearable, you start having fevers, chills, and are unable to keep any meds down - then return to the ER.  ALSO YOU HAVE THE INCIDENTAL FINDING OF THE DISSECTION - PLEASE SEE THE VASCULAR DOCTOR. WE SPOKE WITH DR. CHEN, AND HE THINKS YOU SHOULD SEE HIM IN THE OFFICE.

## 2016-07-18 NOTE — ED Provider Notes (Signed)
Pt has ureteral colic. Pt also has incidental infrarenal dissection.  Results from the ER workup discussed with the patient face to face and all questions answered to the best of my ability.  Dr. Imogene Burnhen from Vascular Surgery will like to see pt in the clinic.  Strict ER return precautions have been discussed, and patient is agreeing with the plan and is comfortable with the workup done and the recommendations from the ER.     Melissa KaplanAnkit Judy Goodenow, MD 07/18/16 [phone]

## 2016-07-18 NOTE — ED Provider Notes (Signed)
[facility] Provider Note   CSN: [id number] Arrival date & time: 07/18/16  [id number]   By signing my name below, I, Freida Busmaniana Omoyeni, attest that this documentation has been prepared under the direction and in the presence of Tomasita CrumbleAdeleke Jahnay Lantier, MD . Electronically Signed: Freida Busmaniana Omoyeni, Scribe. 07/18/2016. 4:02 AM.  History   Chief Complaint Chief Complaint  Patient presents with  . Urinary Tract Infection     The history is provided by the patient. No language interpreter was used.    HPI Comments:  Melissa Hays is a 60 y.o. female who presents to the Emergency Department complaining of pressure in her vagina with associated urinary frequency x 2 weeks. Pt reports h/o UTI, notes symptoms today are similar. She denies vaginal bleeding/ discharge and back pain. No alleviating factors noted.   Past Medical History:  Diagnosis Date  . Hypertension     Patient Active Problem List   Diagnosis Date Noted  . ESSENTIAL HYPERTENSION 01/19/2008  . HAIR LOSS 01/19/2008  . FLANK PAIN, RIGHT 01/19/2008    History reviewed. No pertinent surgical history.  OB History    No data available       Home Medications    Prior to Admission medications   Medication Sig Start Date End Date Taking? Authorizing Provider  omeprazole (PRILOSEC) 20 MG capsule Take 1 capsule (20 mg total) by mouth 2 (two) times daily before a meal. 10/21/15   Azalia BilisKevin Campos, MD    Family History No family history on file.  Social History Social History  Substance Use Topics  . Smoking status: Never Smoker  . Smokeless tobacco: Never Used  . Alcohol use Yes     Allergies   Patient has no known allergies.   Review of Systems Review of Systems  10 systems reviewed and all are negative for acute change except as noted in the HPI.   Physical Exam Updated Vital Signs BP (!) 137/101 (BP Location: Left Arm)   Pulse 86   Temp 97.9 F (36.6 C) (Oral)   Resp 17   Ht 5\' 1"  (1.549 m)   Wt 147 lb 1.6 oz  (66.7 kg)   SpO2 98%   BMI 27.79 kg/m   Physical Exam  Constitutional: She is oriented to person, place, and time. She appears well-developed and well-nourished. She appears distressed.  HENT:  Head: Normocephalic and atraumatic.  Nose: Nose normal.  Mouth/Throat: Oropharynx is clear and moist. No oropharyngeal exudate.  Eyes: Conjunctivae and EOM are normal. Pupils are equal, round, and reactive to light. No scleral icterus.  Neck: Normal range of motion. Neck supple. No JVD present. No tracheal deviation present. No thyromegaly present.  Cardiovascular: Normal rate, regular rhythm and normal heart sounds.  Exam reveals no gallop and no friction rub.   No murmur heard. Pulmonary/Chest: Effort normal and breath sounds normal. No respiratory distress. She has no wheezes. She exhibits no tenderness.  Abdominal: Soft. Bowel sounds are normal. She exhibits no distension and no mass. There is tenderness in the suprapubic area. There is no rebound and no guarding.  Musculoskeletal: Normal range of motion. She exhibits no edema or tenderness.  Lymphadenopathy:    She has no cervical adenopathy.  Neurological: She is alert and oriented to person, place, and time. No cranial nerve deficit. She exhibits normal muscle tone.  Skin: Skin is warm and dry. No rash noted. No erythema. No pallor.  Nursing note and vitals reviewed.    ED Treatments / Results  DIAGNOSTIC STUDIES:  Oxygen Saturation is 98% on RA, normal by my interpretation.    COORDINATION OF CARE:  3:54 AM Discussed treatment plan with pt at bedside and pt agreed to plan.  Labs (all labs ordered are listed, but only abnormal results are displayed) Labs Reviewed  WET PREP, GENITAL  URINALYSIS, ROUTINE W REFLEX MICROSCOPIC  POC URINE PREG, ED  GC/CHLAMYDIA PROBE AMP ([place]) NOT AT [facility]    EKG  EKG Interpretation None       Radiology No results found.  Procedures Procedures (including critical care  time)  Medications Ordered in ED Medications - No data to display   Initial Impression / Assessment and Plan / ED Course  I have reviewed the triage vital signs and the nursing notes.  Pertinent labs & imaging results that were available during my care of the patient were reviewed by me and considered in my medical decision making (see chart for details).  Clinical Course     Patient presents to the ED for abd pain and pressure going down to her vagina.  She is in mild distress and warrants CT for evluation.  She was given tramadol followed by morphine and zofran for her symptoms.  Will give IVF as well.  Patient signed out pending CT scan results to Dr. Rhunette CroftNAnavati.  Final Clinical Impressions(s) / ED Diagnoses   Final diagnoses:  None    New Prescriptions New Prescriptions   No medications on file    I personally performed the services described in this documentation, which was scribed in my presence. The recorded information has been reviewed and is accurate.       Tomasita CrumbleAdeleke Avondre Richens, MD 07/30/16 1440

## 2016-07-18 NOTE — ED Notes (Signed)
Delay in lab draw edp examaning pt 

## 2016-07-19 LAB — GC/CHLAMYDIA PROBE AMP (~~LOC~~) NOT AT ARMC
Chlamydia: NEGATIVE
Neisseria Gonorrhea: NEGATIVE

## 2016-09-03 ENCOUNTER — Encounter: Payer: Self-pay | Admitting: Vascular Surgery

## 2016-09-06 ENCOUNTER — Encounter: Payer: BLUE CROSS/BLUE SHIELD | Admitting: Vascular Surgery

## 2016-10-01 ENCOUNTER — Other Ambulatory Visit: Payer: Self-pay | Admitting: Family Medicine

## 2016-10-01 DIAGNOSIS — Z1231 Encounter for screening mammogram for malignant neoplasm of breast: Secondary | ICD-10-CM

## 2016-10-15 ENCOUNTER — Ambulatory Visit
Admission: RE | Admit: 2016-10-15 | Discharge: 2016-10-15 | Disposition: A | Discharge status: Home / Self Care | Payer: BLUE CROSS/BLUE SHIELD | Source: Ambulatory Visit | Attending: Family Medicine | Admitting: Family Medicine

## 2016-10-15 DIAGNOSIS — Z1231 Encounter for screening mammogram for malignant neoplasm of breast: Secondary | ICD-10-CM

## 2020-01-18 ENCOUNTER — Ambulatory Visit (HOSPITAL_COMMUNITY)
Admission: EM | Admit: 2020-01-18 | Discharge: 2020-01-18 | Disposition: A | Discharge status: Home / Self Care | Payer: Commercial Managed Care - PPO | Attending: Family Medicine | Admitting: Family Medicine

## 2020-01-18 ENCOUNTER — Other Ambulatory Visit: Payer: Self-pay

## 2020-01-18 ENCOUNTER — Ambulatory Visit (INDEPENDENT_AMBULATORY_CARE_PROVIDER_SITE_OTHER): Payer: Commercial Managed Care - PPO

## 2020-01-18 ENCOUNTER — Encounter (HOSPITAL_COMMUNITY): Payer: Self-pay | Admitting: Emergency Medicine

## 2020-01-18 DIAGNOSIS — M7989 Other specified soft tissue disorders: Secondary | ICD-10-CM | POA: Diagnosis not present

## 2020-01-18 DIAGNOSIS — Z23 Encounter for immunization: Secondary | ICD-10-CM | POA: Diagnosis not present

## 2020-01-18 DIAGNOSIS — S9032XA Contusion of left foot, initial encounter: Secondary | ICD-10-CM

## 2020-01-18 DIAGNOSIS — M79672 Pain in left foot: Secondary | ICD-10-CM

## 2020-01-18 DIAGNOSIS — R238 Other skin changes: Secondary | ICD-10-CM

## 2020-01-18 DIAGNOSIS — H6121 Impacted cerumen, right ear: Secondary | ICD-10-CM | POA: Diagnosis not present

## 2020-01-18 MED ORDER — IBUPROFEN 600 MG PO TABS
600.0000 mg | ORAL_TABLET | Freq: Three times a day (TID) | ORAL | 0 refills | Status: DC | PRN
Start: 2020-01-18 — End: 2021-04-23

## 2020-01-18 MED ORDER — HYDROCODONE-ACETAMINOPHEN 5-325 MG PO TABS: 1.0000 | ORAL_TABLET | Freq: Four times a day (QID) | ORAL | 0 refills | Status: AC | PRN

## 2020-01-18 MED ORDER — TETANUS-DIPHTH-ACELL PERTUSSIS 5-2.5-18.5 LF-MCG/0.5 IM SUSP
0.5000 mL | Freq: Once | INTRAMUSCULAR | Status: AC
  Administered 2020-01-18: 0.5 mL via INTRAMUSCULAR

## 2020-01-18 MED ORDER — HYDROCODONE-ACETAMINOPHEN 5-325 MG PO TABS: 1.0000 | ORAL_TABLET | Freq: Four times a day (QID) | ORAL | 0 refills | Status: DC | PRN

## 2020-01-18 MED ORDER — TETANUS-DIPHTH-ACELL PERTUSSIS 5-2.5-18.5 LF-MCG/0.5 IM SUSP
INTRAMUSCULAR | Status: AC
  Filled 2020-01-18: qty 0.5

## 2020-01-18 NOTE — ED Triage Notes (Addendum)
A metal pole dropped on left foot yesterday.  There is a puncture to skin.  Left foot is swollen and painful.  Unknown when last tetanus was .  Patient can move toes , pulse palpable in left foot  Patient has a puffy area on left lower leg that she noticed 2 days ago

## 2020-01-18 NOTE — ED Provider Notes (Signed)
[facility]    CSN: [id number] Arrival date & time: 01/18/20  0807      History   Chief Complaint Chief Complaint  Patient presents with  . Foot Pain    HPI Melissa Hays is a 64 y.o. female.   Patient is a 64 year old female with past medical history of hypertension.  She presents today with left foot pain.  Pain is located to the top of the foot.  Reporting she dropped a metal pole on the foot yesterday.  Small puncture to top of foot.  No current bleeding.  Reporting a lot of bleeding after the puncture initially happened.  Unknown last tetanus shot.  She is having a lot of pain with ambulation of the foot.  She has been resting, elevating and icing the foot.  She took Excedrin without much relief.  No numbness, tingling or loss of sensation.  Patient also has decreased hearing to the right ear.  She has been told she had wax buildup in that ear and has been using Debrox drops without much relief.  No ear pain  ROS per HPI      Past Medical History:  Diagnosis Date  . Hypertension     Patient Active Problem List   Diagnosis Date Noted  . ESSENTIAL HYPERTENSION 01/19/2008  . HAIR LOSS 01/19/2008  . FLANK PAIN, RIGHT 01/19/2008    History reviewed. No pertinent surgical history.  OB History   No obstetric history on file.      Home Medications    Prior to Admission medications   Medication Sig Start Date End Date Taking? Authorizing Provider  amLODipine-olmesartan (AZOR) 10-40 MG tablet Take 1 tablet by mouth daily.   Yes [provider]  aspirin EC 81 MG tablet Take 81 mg by mouth daily.   Yes [provider]  Multiple Vitamin (MULTIVITAMIN WITH MINERALS) TABS tablet Take 1 tablet by mouth daily.   Yes [provider]  Omega-3 Fatty Acids (FISH OIL PO) Take 1 tablet by mouth daily.   Yes [provider]  spironolactone (ALDACTONE) 25 MG tablet Take 25 mg by mouth daily.   Yes [provider]    HYDROcodone-acetaminophen (NORCO/VICODIN) 5-325 MG tablet Take 1-2 tablets by mouth every 6 (six) hours as needed. 01/18/20   Dahlia Byes A, NP  ibuprofen (ADVIL) 600 MG tablet Take 1 tablet (600 mg total) by mouth every 8 (eight) hours as needed for moderate pain. 01/18/20   Janace Aris, NP    Family History History reviewed. No pertinent family history.  Social History Social History   Tobacco Use  . Smoking status: Never Smoker  . Smokeless tobacco: Never Used  Substance Use Topics  . Alcohol use: Never  . Drug use: Never     Allergies   Patient has no known allergies.   Review of Systems Review of Systems   Physical Exam Triage Vital Signs ED Triage Vitals  Enc Vitals Group     BP 01/18/20 0834 (!) 146/85     Pulse Rate 01/18/20 0834 88     Resp 01/18/20 0834 18     Temp 01/18/20 0834 99.4 F (37.4 C)     Temp src --      SpO2 01/18/20 0834 95 %     Weight --      Height --      Head Circumference --      Peak Flow --      Pain Score 01/18/20 0830  10     Pain Loc --      Pain Edu? --      Excl. in [place]? --    No data found.  Updated Vital Signs BP (!) 146/85 (BP Location: Left Arm)   Pulse 88   Temp 99.4 F (37.4 C)   Resp 18   SpO2 95%   Visual Acuity Right Eye Distance:   Left Eye Distance:   Bilateral Distance:    Right Eye Near:   Left Eye Near:    Bilateral Near:     Physical Exam Vitals and nursing note reviewed.  Constitutional:      General: She is not in acute distress.    Appearance: Normal appearance. She is not ill-appearing, toxic-appearing or diaphoretic.  HENT:     Head: Normocephalic.     Right Ear: There is impacted cerumen.     Nose: Nose normal.     Mouth/Throat:     Pharynx: Oropharynx is clear.  Eyes:     Conjunctiva/sclera: Conjunctivae normal.  Pulmonary:     Effort: Pulmonary effort is normal.  Musculoskeletal:        General: Normal range of motion.     Cervical back: Normal range of motion.        Feet:  Feet:     Comments: TTP swelling with hematoma 2+pulse Able to wiggle toes.  Neurovascular intact Skin:    General: Skin is warm and dry.     Findings: No rash.  Neurological:     Mental Status: She is alert.  Psychiatric:        Mood and Affect: Mood normal.      UC Treatments / Results  Labs (all labs ordered are listed, but only abnormal results are displayed) Labs Reviewed - No data to display  EKG   Radiology DG Foot Complete Left  Result Date: 01/18/2020 CLINICAL DATA:  Metal pole fall on LEFT foot 1 day ago and poked skin, LEFT foot pain, swelling and bruising EXAM: LEFT FOOT - COMPLETE 3+ VIEW COMPARISON:  None FINDINGS: Osseous demineralization. Spurring at first MTP joint. Joint spaces preserved. No acute fracture, dislocation, or bone destruction. Soft tissue swelling at dorsum of LEFT foot. IMPRESSION: No acute osseous abnormalities. Mild degenerative changes first MTP joint. Electronically Signed   By: Lavonia Dana M.D.   On: 01/18/2020 09:12    Procedures Procedures (including critical care time)  Medications Ordered in UC Medications  Tdap (BOOSTRIX) injection 0.5 mL (0.5 mLs Intramuscular Given 01/18/20 0942)    Initial Impression / Assessment and Plan / UC Course  I have reviewed the triage vital signs and the nursing notes.  Pertinent labs & imaging results that were available during my care of the patient were reviewed by me and considered in my medical decision making (see chart for details).  Clinical Course as of Jan 17 1001  Tue Jan 18, 2020  0915 DG Foot Complete Left [TB]    Clinical Course User Index [TB] Loura Halt A, NP    Foot contusion X-ray without any acute fractures.  Most likely bad bruising. We will have her keep resting, icing and elevating. Ibuprofen 600 mg for mild to moderate pain and hydrocodone for severe pain Work note given to rest.  Tetanus updated Infection precautions given.  Cerumen impaction Ear flushed  here with saline Patient hearing improved No signs of infection.  Follow up as needed for continued or worsening symptoms  Final Clinical Impressions(s) / UC Diagnoses  Final diagnoses:  Contusion of left foot, initial encounter  Impacted cerumen of right ear     Discharge Instructions     Your x-ray not show any fractures.  This is a really bad bruise. Keep icing, elevating and take the pain medicine as needed. Please watch for signs of infection.  We have updated your tetanus today. Work note given to rest Follow up as needed for continued or worsening symptoms     ED Prescriptions    Medication Sig Dispense Auth. Provider   HYDROcodone-acetaminophen (NORCO/VICODIN) 5-325 MG tablet  (Status: Discontinued) Take 1-2 tablets by mouth every 6 (six) hours as needed. 12 tablet Harald Quevedo A, NP   ibuprofen (ADVIL) 600 MG tablet Take 1 tablet (600 mg total) by mouth every 8 (eight) hours as needed for moderate pain. 30 tablet Luster Hechler A, NP   HYDROcodone-acetaminophen (NORCO/VICODIN) 5-325 MG tablet Take 1-2 tablets by mouth every 6 (six) hours as needed. 12 tablet Birdia Jaycox A, NP     I have reviewed the PDMP during this encounter.   Dahlia Byes A, NP 01/18/20 1003

## 2020-01-18 NOTE — Discharge Instructions (Addendum)
Your x-ray not show any fractures.  This is a really bad bruise. Keep icing, elevating and take the pain medicine as needed. Please watch for signs of infection.  We have updated your tetanus today. Work note given to rest Follow up as needed for continued or worsening symptoms

## 2020-05-25 ENCOUNTER — Other Ambulatory Visit: Payer: Self-pay | Admitting: Family Medicine

## 2020-05-25 DIAGNOSIS — Z1231 Encounter for screening mammogram for malignant neoplasm of breast: Secondary | ICD-10-CM

## 2020-06-27 ENCOUNTER — Ambulatory Visit
Admission: RE | Admit: 2020-06-27 | Discharge: 2020-06-27 | Disposition: A | Discharge status: Home / Self Care | Payer: Commercial Managed Care - PPO | Source: Ambulatory Visit | Attending: Family Medicine | Admitting: Family Medicine

## 2020-06-27 ENCOUNTER — Other Ambulatory Visit: Payer: Self-pay

## 2020-06-27 DIAGNOSIS — Z1231 Encounter for screening mammogram for malignant neoplasm of breast: Secondary | ICD-10-CM

## 2021-04-23 ENCOUNTER — Encounter (HOSPITAL_COMMUNITY): Payer: Self-pay | Admitting: Emergency Medicine

## 2021-04-23 ENCOUNTER — Ambulatory Visit (HOSPITAL_COMMUNITY)
Admission: EM | Admit: 2021-04-23 | Discharge: 2021-04-23 | Disposition: A | Discharge status: Home / Self Care | Payer: BLUE CROSS/BLUE SHIELD | Attending: Emergency Medicine | Admitting: Emergency Medicine

## 2021-04-23 ENCOUNTER — Other Ambulatory Visit: Payer: Self-pay

## 2021-04-23 DIAGNOSIS — S93602A Unspecified sprain of left foot, initial encounter: Secondary | ICD-10-CM | POA: Diagnosis not present

## 2021-04-23 DIAGNOSIS — M79672 Pain in left foot: Secondary | ICD-10-CM

## 2021-04-23 MED ORDER — IBUPROFEN 600 MG PO TABS: 600.0000 mg | ORAL_TABLET | Freq: Three times a day (TID) | ORAL | 0 refills | Status: AC | PRN

## 2021-04-23 NOTE — ED Triage Notes (Signed)
Pt c/o left foot pain that and some swelling that started on Saturday when got a cramp in foot when walking up stairs.

## 2021-04-23 NOTE — ED Provider Notes (Signed)
[facility]    CSN: [id number] Arrival date & time: 04/23/21  0801      History   Chief Complaint Chief Complaint  Patient presents with   Foot Pain    HPI Melissa Hays is a 65 y.o. female.   Patient here for evaluation of left foot pain and swelling.  Reports pain started on Saturday while walking up the stairs.  Reports pain starting as a cramping and is now a throbbing constant pain.  Reports pain is worse with movement or when bearing weight.  Reports elevating foot with minimal relief.  Has not taken any OTC medication or treatments.  Denies any trauma, injury, or other precipitating event.  Denies any specific alleviating factors.  Denies any fevers, chest pain, shortness of breath, N/V/D, numbness, tingling, weakness, abdominal pain, or headaches.    The history is provided by the patient.  Foot Pain   Past Medical History:  Diagnosis Date   Hypertension     Patient Active Problem List   Diagnosis Date Noted   ESSENTIAL HYPERTENSION 01/19/2008   HAIR LOSS 01/19/2008   FLANK PAIN, RIGHT 01/19/2008    History reviewed. No pertinent surgical history.  OB History   No obstetric history on file.      Home Medications    Prior to Admission medications   Medication Sig Start Date End Date Taking? Authorizing Provider  amLODipine-olmesartan (AZOR) 10-40 MG tablet Take 1 tablet by mouth daily.    [provider]  aspirin EC 81 MG tablet Take 81 mg by mouth daily.    [provider]  HYDROcodone-acetaminophen (NORCO/VICODIN) 5-325 MG tablet Take 1-2 tablets by mouth every 6 (six) hours as needed. 01/18/20   Dahlia Byes A, NP  ibuprofen (ADVIL) 600 MG tablet Take 1 tablet (600 mg total) by mouth every 8 (eight) hours as needed for moderate pain. 04/23/21   Ivette Loyal, NP  Multiple Vitamin (MULTIVITAMIN WITH MINERALS) TABS tablet Take 1 tablet by mouth daily.    [provider]  Omega-3 Fatty Acids (FISH OIL PO) Take 1  tablet by mouth daily.    [provider]  spironolactone (ALDACTONE) 25 MG tablet Take 25 mg by mouth daily.    [provider]    Family History No family history on file.  Social History Social History   Tobacco Use   Smoking status: Never   Smokeless tobacco: Never  Substance Use Topics   Alcohol use: Never   Drug use: Never     Allergies   Patient has no known allergies.   Review of Systems Review of Systems  Musculoskeletal:  Positive for arthralgias and joint swelling.  All other systems reviewed and are negative.   Physical Exam Triage Vital Signs ED Triage Vitals  Enc Vitals Group     BP 04/23/21 0818 131/86     Pulse Rate 04/23/21 0818 81     Resp 04/23/21 0818 18     Temp 04/23/21 0818 98.5 F (36.9 C)     Temp Source 04/23/21 0818 Oral     SpO2 04/23/21 0818 95 %     Weight --      Height --      Head Circumference --      Peak Flow --      Pain Score 04/23/21 0817 7     Pain Loc --      Pain Edu? --      Excl. in GC? --  No data found.  Updated Vital Signs BP 131/86 (BP Location: Left Arm)   Pulse 81   Temp 98.5 F (36.9 C) (Oral)   Resp 18   SpO2 95%   Visual Acuity Right Eye Distance:   Left Eye Distance:   Bilateral Distance:    Right Eye Near:   Left Eye Near:    Bilateral Near:     Physical Exam Vitals and nursing note reviewed.  Constitutional:      General: She is not in acute distress.    Appearance: Normal appearance. She is not ill-appearing, toxic-appearing or diaphoretic.  HENT:     Head: Normocephalic and atraumatic.  Eyes:     Conjunctiva/sclera: Conjunctivae normal.  Cardiovascular:     Rate and Rhythm: Normal rate.     Pulses: Normal pulses.  Pulmonary:     Effort: Pulmonary effort is normal.  Abdominal:     General: Abdomen is flat.  Musculoskeletal:        General: Normal range of motion.     Cervical back: Normal range of motion.     Left foot: Normal range of motion and normal  capillary refill. Swelling (top of left foot) and tenderness present. No deformity or bony tenderness. Normal pulse.       Feet:  Feet:     Left foot:     Skin integrity: Skin integrity normal. No erythema or warmth.  Skin:    General: Skin is warm and dry.  Neurological:     General: No focal deficit present.     Mental Status: She is alert and oriented to person, place, and time.  Psychiatric:        Mood and Affect: Mood normal.     UC Treatments / Results  Labs (all labs ordered are listed, but only abnormal results are displayed) Labs Reviewed - No data to display  EKG   Radiology No results found.  Procedures Procedures (including critical care time)  Medications Ordered in UC Medications - No data to display  Initial Impression / Assessment and Plan / UC Course  I have reviewed the triage vital signs and the nursing notes.  Pertinent labs & imaging results that were available during my care of the patient were reviewed by me and considered in my medical decision making (see chart for details).    Assessment negative for red flags or concerns.  Left foot pain, possible sprain vs tendonitis. Will treat with ibuprofen 3 times a day for 5 days and then as needed.  Recommend rest, ice, compression, and elevation.  Follow up with PCP or orthopedics if symptoms do not improve in the next week. Final Clinical Impressions(s) / UC Diagnoses   Final diagnoses:  Sprain of left foot, initial encounter  Foot pain, left     Discharge Instructions      Take the Ibuprofen 3 times a day for the next 5 days and then as needed for pain.   Rest as much as possible Ice for 10-15 minutes every 4-6 hours as needed for pain and swelling Compression- use an ace bandage or splint for comfort Elevate above your hip/heart when sitting and laying down  Follow up with sports medicine or orthopedics if symptoms do not improve in the next week.      ED Prescriptions      Medication Sig Dispense Auth. Provider   ibuprofen (ADVIL) 600 MG tablet Take 1 tablet (600 mg total) by mouth every 8 (eight) hours as needed  for moderate pain. 30 tablet Ivette Loyal, NP      PDMP not reviewed this encounter.   Ivette Loyal, NP 04/23/21 [phone]

## 2021-04-23 NOTE — Discharge Instructions (Signed)
Take the Ibuprofen 3 times a day for the next 5 days and then as needed for pain.   Rest as much as possible Ice for 10-15 minutes every 4-6 hours as needed for pain and swelling Compression- use an ace bandage or splint for comfort Elevate above your hip/heart when sitting and laying down  Follow up with sports medicine or orthopedics if symptoms do not improve in the next week.

## 2021-05-02 ENCOUNTER — Emergency Department (HOSPITAL_COMMUNITY): Payer: BLUE CROSS/BLUE SHIELD

## 2021-05-02 ENCOUNTER — Emergency Department (HOSPITAL_COMMUNITY)
Admission: EM | Admit: 2021-05-02 | Discharge: 2021-05-03 | Disposition: A | Discharge status: Home / Self Care | Payer: BLUE CROSS/BLUE SHIELD | Attending: Emergency Medicine | Admitting: Emergency Medicine

## 2021-05-02 DIAGNOSIS — N200 Calculus of kidney: Secondary | ICD-10-CM

## 2021-05-02 DIAGNOSIS — Z79899 Other long term (current) drug therapy: Secondary | ICD-10-CM | POA: Insufficient documentation

## 2021-05-02 DIAGNOSIS — I10 Essential (primary) hypertension: Secondary | ICD-10-CM | POA: Insufficient documentation

## 2021-05-02 DIAGNOSIS — R109 Unspecified abdominal pain: Secondary | ICD-10-CM | POA: Diagnosis present

## 2021-05-02 DIAGNOSIS — Z7982 Long term (current) use of aspirin: Secondary | ICD-10-CM | POA: Insufficient documentation

## 2021-05-02 DIAGNOSIS — N132 Hydronephrosis with renal and ureteral calculous obstruction: Secondary | ICD-10-CM | POA: Insufficient documentation

## 2021-05-02 LAB — COMPREHENSIVE METABOLIC PANEL
ALT: 13 U/L (ref 0–44)
AST: 16 U/L (ref 15–41)
Albumin: 3.9 g/dL (ref 3.5–5.0)
Alkaline Phosphatase: 58 U/L (ref 38–126)
Anion gap: 9 (ref 5–15)
BUN: 13 mg/dL (ref 8–23)
CO2: 27 mmol/L (ref 22–32)
Calcium: 9.7 mg/dL (ref 8.9–10.3)
Chloride: 103 mmol/L (ref 98–111)
Creatinine, Ser: 0.99 mg/dL (ref 0.44–1.00)
GFR, Estimated: >60 mL/min (ref >60)
Glucose, Bld: 158 mg/dL — ABNORMAL HIGH (ref 70–99)
Potassium: 3.7 mmol/L (ref 3.5–5.1)
Sodium: 139 mmol/L (ref 135–145)
Total Bilirubin: 0.8 mg/dL (ref 0.3–1.2)
Total Protein: 7.8 g/dL (ref 6.5–8.1)

## 2021-05-02 LAB — CBC WITH DIFFERENTIAL/PLATELET
Abs Immature Granulocytes: 0.04 10*3/uL (ref 0.00–0.07)
Basophils Absolute: 0 10*3/uL (ref 0.0–0.1)
Basophils Relative: 0 %
Eosinophils Absolute: 0.1 10*3/uL (ref 0.0–0.5)
Eosinophils Relative: 1 %
HCT: 39.1 % (ref 36.0–46.0)
Hemoglobin: 12.7 g/dL (ref 12.0–15.0)
Immature Granulocytes: 0 %
Lymphocytes Relative: 25 %
Lymphs Abs: 2.4 10*3/uL (ref 0.7–4.0)
MCH: 27.4 pg (ref 26.0–34.0)
MCHC: 32.5 g/dL (ref 30.0–36.0)
MCV: 84.4 fL (ref 80.0–100.0)
Monocytes Absolute: 0.4 10*3/uL (ref 0.1–1.0)
Monocytes Relative: 5 %
Neutro Abs: 6.5 10*3/uL (ref 1.7–7.7)
Neutrophils Relative %: 69 %
Platelets: 293 10*3/uL (ref 150–400)
RBC: 4.63 MIL/uL (ref 3.87–5.11)
RDW: 13 % (ref 11.5–15.5)
WBC: 9.5 10*3/uL (ref 4.0–10.5)
nRBC: 0 % (ref 0.0–0.2)

## 2021-05-02 LAB — URINALYSIS, MICROSCOPIC (REFLEX): RBC / HPF: >50 RBC/hpf (ref 0–5)

## 2021-05-02 LAB — URINALYSIS, ROUTINE W REFLEX MICROSCOPIC
Bilirubin Urine: NEGATIVE
Glucose, UA: NEGATIVE mg/dL
Ketones, ur: NEGATIVE mg/dL
Nitrite: NEGATIVE
Protein, ur: 30 mg/dL — AB
Specific Gravity, Urine: 1.015 (ref 1.005–1.030)
pH: 7 (ref 5.0–8.0)

## 2021-05-02 LAB — LIPASE, BLOOD: Lipase: 25 U/L (ref 11–51)

## 2021-05-02 MED ORDER — OXYCODONE-ACETAMINOPHEN 5-325 MG PO TABS
1.0000 | ORAL_TABLET | Freq: Once | ORAL | Status: AC
  Administered 2021-05-02: 1 via ORAL
  Filled 2021-05-02: qty 1

## 2021-05-02 MED ORDER — ONDANSETRON 4 MG PO TBDP
4.0000 mg | ORAL_TABLET | Freq: Once | ORAL | Status: AC
  Administered 2021-05-02: 4 mg via ORAL
  Filled 2021-05-02: qty 1

## 2021-05-02 NOTE — ED Provider Notes (Addendum)
Emergency Medicine Provider Triage Evaluation Note  Melissa Hays , a 65 y.o. female  was evaluated in triage.  Pt complains of left flank pain.  Pain started today approximately 100.  Pain has been constant since then.  Pain radiates to left side abdomen.  Patient endorses and vomiting.  Hematemesis or coffee-ground emesis.  Patient reports that she is going through menopause.  Patient reports history of kidney stones.  Review of Systems  Positive: Flank pain, abdominal pain, nausea, vomiting Negative: Fever, chills, difficulty urinating, dysuria, hematuria, urinary urgency, vaginal pain, vaginal bleeding, vaginal discharge  Physical Exam  BP (!) 125/102 (BP Location: Left Arm)   Pulse 75   Temp 98.1 F (36.7 C) (Oral)   Resp 16   SpO2 97%  Gen:   Awake, no distress   Resp:  Normal effort  MSK:   Moves extremities without difficulty  Other:  Abdomen soft, nondistended, tenderness to left upper and  lower quadrant, positive CVA tenderness.  Medical Decision Making  Medically screening exam initiated at 2:32 PM.  Appropriate orders placed.  Evette Cristal was informed that the remainder of the evaluation will be completed by another provider, this initial triage assessment does not replace that evaluation, and the importance of remaining in the ED until their evaluation is complete.  The patient appears stable so that the remainder of the work up may be completed by another provider.    Patient was ordered Zofran and Percocet triage.  Patient vomited immediately after having Percocet.  Therefore will order another round of Percocet.   Haskel Schroeder, PA-C 05/02/21 1436    Haskel Schroeder, PA-C 05/02/21 1444    Berneice Heinrich 05/02/21 1450    Gerhard Munch, MD 05/04/21 1954

## 2021-05-02 NOTE — ED Triage Notes (Signed)
Pt reports left flank beginning at 11am. Pt with hx of kidney stones with ablation. Pt rating pain 10/10.

## 2021-05-03 MED ORDER — SODIUM CHLORIDE 0.9 % IV SOLN
1.0000 g | Freq: Once | INTRAVENOUS | Status: AC
  Administered 2021-05-03: 1 g via INTRAVENOUS
  Filled 2021-05-03: qty 10

## 2021-05-03 MED ORDER — CEPHALEXIN 500 MG PO CAPS: 500.0000 mg | ORAL_CAPSULE | Freq: Four times a day (QID) | ORAL | 0 refills | Status: AC

## 2021-05-03 MED ORDER — MORPHINE SULFATE (PF) 4 MG/ML IV SOLN
4.0000 mg | Freq: Once | INTRAVENOUS | Status: DC
  Filled 2021-05-03: qty 1

## 2021-05-03 NOTE — Discharge Instructions (Signed)
1. Medications: Keflex, usual home medications 2. Treatment: rest, drink plenty of fluids,  3. Follow Up: Please followup with your urologist in 2-3 days for discussion of your diagnoses and further evaluation after today's visit; if you do not have a primary care doctor use the resource guide provided to find one; Please return to the ER for return of pain, vomiting, fevers or other concerns

## 2021-05-03 NOTE — ED Provider Notes (Addendum)
[facility] EMERGENCY DEPARTMENT Provider Note   CSN: [id number] Arrival date & time: 05/02/21  1321     History Chief Complaint  Patient presents with   Flank Pain    Melissa Hays is a 65 y.o. female with a history of hypertension presents with left-sided flank pain onset today around 1 PM.  Patient reports pain has been constant, persistent and progressively worsening.  Reports that it radiates around to the left side of her abdomen and she has had nausea and vomiting.  Emesis NBNB.  Pt reports pain and nausea resolved completely after percocet and zofran. Reports she feels well now.  Denies urinary symptoms including dysuria.  Denies fevers at home.    Records reviewed.  Pt records reviewed.  She has been followed by Dr. Pete Glatter of [facility] urology in the past.  She does wish to return to him.  The history is provided by the patient and medical records. No language interpreter was used.      Past Medical History:  Diagnosis Date   Hypertension     Patient Active Problem List   Diagnosis Date Noted   ESSENTIAL HYPERTENSION 01/19/2008   HAIR LOSS 01/19/2008   FLANK PAIN, RIGHT 01/19/2008    No past surgical history on file.   OB History   No obstetric history on file.     No family history on file.  Social History   Tobacco Use   Smoking status: Never   Smokeless tobacco: Never  Substance Use Topics   Alcohol use: Never   Drug use: Never    Home Medications Prior to Admission medications   Medication Sig Start Date End Date Taking? Authorizing Provider  cephALEXin (KEFLEX) 500 MG capsule Take 1 capsule (500 mg total) by mouth 4 (four) times daily. 05/03/21  Yes Damira Kem, Dahlia Client, PA-C  amLODipine-olmesartan (AZOR) 10-40 MG tablet Take 1 tablet by mouth daily.    [provider]  aspirin EC 81 MG tablet Take 81 mg by mouth daily.    [provider]  HYDROcodone-acetaminophen (NORCO/VICODIN) 5-325 MG  tablet Take 1-2 tablets by mouth every 6 (six) hours as needed. 01/18/20   Dahlia Byes A, NP  ibuprofen (ADVIL) 600 MG tablet Take 1 tablet (600 mg total) by mouth every 8 (eight) hours as needed for moderate pain. 04/23/21   Ivette Loyal, NP  Multiple Vitamin (MULTIVITAMIN WITH MINERALS) TABS tablet Take 1 tablet by mouth daily.    [provider]  Omega-3 Fatty Acids (FISH OIL PO) Take 1 tablet by mouth daily.    [provider]  spironolactone (ALDACTONE) 25 MG tablet Take 25 mg by mouth daily.    [provider]    Allergies    Patient has no known allergies.  Review of Systems   Review of Systems  Constitutional:  Negative for appetite change, diaphoresis, fatigue, fever and unexpected weight change.  HENT:  Negative for mouth sores.   Eyes:  Negative for visual disturbance.  Respiratory:  Negative for cough, chest tightness, shortness of breath and wheezing.   Cardiovascular:  Negative for chest pain.  Gastrointestinal:  Negative for abdominal pain, constipation, diarrhea, nausea and vomiting.  Endocrine: Negative for polydipsia, polyphagia and polyuria.  Genitourinary:  Positive for flank pain. Negative for dysuria, frequency, hematuria and urgency.  Musculoskeletal:  Negative for back pain and neck stiffness.  Skin:  Negative for rash.  Allergic/Immunologic: Negative for immunocompromised state.  Neurological:  Negative for syncope, light-headedness  and headaches.  Hematological:  Does not bruise/bleed easily.  Psychiatric/Behavioral:  Negative for sleep disturbance. The patient is not nervous/anxious.    Physical Exam Updated Vital Signs BP 131/85 (BP Location: Left Arm)   Pulse 78   Temp 98.1 F (36.7 C) (Oral)   Resp 16   Ht 5\' 1"  (1.549 m)   Wt 72.6 kg   SpO2 98%   BMI 30.23 kg/m   Physical Exam Vitals and nursing note reviewed.  Constitutional:      General: She is not in acute distress.    Appearance: She is not diaphoretic.  HENT:      Head: Normocephalic.  Eyes:     General: No scleral icterus.    Conjunctiva/sclera: Conjunctivae normal.  Cardiovascular:     Rate and Rhythm: Normal rate and regular rhythm.     Pulses: Normal pulses.          Radial pulses are 2+ on the right side and 2+ on the left side.  Pulmonary:     Effort: No tachypnea, accessory muscle usage, prolonged expiration, respiratory distress or retractions.     Breath sounds: No stridor.     Comments: Equal chest rise. No increased work of breathing. Abdominal:     General: There is no distension.     Palpations: Abdomen is soft.     Tenderness: There is no abdominal tenderness. There is no guarding or rebound.  Musculoskeletal:     Cervical back: Normal range of motion.     Comments: Moves all extremities equally and without difficulty.  Skin:    General: Skin is warm and dry.     Capillary Refill: Capillary refill takes less than 2 seconds.  Neurological:     Mental Status: She is alert.     GCS: GCS eye subscore is 4. GCS verbal subscore is 5. GCS motor subscore is 6.     Comments: Speech is clear and goal oriented.  Psychiatric:        Mood and Affect: Mood normal.    ED Results / Procedures / Treatments   Labs (all labs ordered are listed, but only abnormal results are displayed) Labs Reviewed  COMPREHENSIVE METABOLIC PANEL - Abnormal; Notable for the following components:      Result Value   Glucose, Bld 158 (*)    All other components within normal limits  URINALYSIS, ROUTINE W REFLEX MICROSCOPIC - Abnormal; Notable for the following components:   Hgb urine dipstick MODERATE (*)    Protein, ur 30 (*)    Leukocytes,Ua SMALL (*)    All other components within normal limits  URINALYSIS, MICROSCOPIC (REFLEX) - Abnormal; Notable for the following components:   Bacteria, UA RARE (*)    All other components within normal limits  URINE CULTURE  LIPASE, BLOOD  CBC WITH DIFFERENTIAL/PLATELET     Radiology CT RENAL STONE  STUDY  Result Date: 05/02/2021 CLINICAL DATA:  Left flank pain. EXAM: CT ABDOMEN AND PELVIS WITHOUT CONTRAST TECHNIQUE: Multidetector CT imaging of the abdomen and pelvis was performed following the standard protocol without IV contrast. COMPARISON:  July 18, 2016. FINDINGS: Lower chest: No acute abnormality. Hepatobiliary: No focal liver abnormality is seen. No gallstones, gallbladder wall thickening, or biliary dilatation. Pancreas: Unremarkable. No pancreatic ductal dilatation or surrounding inflammatory changes. Spleen: Normal in size without focal abnormality. Adrenals/Urinary Tract: Adrenal glands appear normal. Bilateral renal cysts are noted. Bilateral nephrolithiasis is noted, left-greater-than-right. Moderate left hydroureteronephrosis is noted secondary to 5 mm distal left  ureteral calculus. 5 mm nonobstructive calculus is noted in the right renal pelvis. Stomach/Bowel: Stomach is within normal limits. Appendix appears normal. No evidence of bowel wall thickening, distention, or inflammatory changes. Vascular/Lymphatic: Aortic atherosclerosis. No enlarged abdominal or pelvic lymph nodes. Reproductive: Status post hysterectomy. No adnexal masses. Other: No abdominal wall hernia or abnormality. No abdominopelvic ascites. Musculoskeletal: No acute or significant osseous findings. IMPRESSION: Multiple bilateral renal cysts are again noted. Bilateral nephrolithiasis is noted, left-greater-than-right. Moderate left hydroureteronephrosis is noted secondary to 5 mm distal left ureteral calculus. Aortic Atherosclerosis (ICD10-I70.0). Electronically Signed   By: Lupita Raider M.D.   On: 05/02/2021 15:37    Procedures Procedures   Medications Ordered in ED Medications  morphine 4 MG/ML injection 4 mg (0 mg Intravenous Hold 05/03/21 0459)  ondansetron (ZOFRAN-ODT) disintegrating tablet 4 mg (4 mg Oral Given 05/02/21 1442)  oxyCODONE-acetaminophen (PERCOCET/ROXICET) 5-325 MG per tablet 1 tablet (1 tablet  Oral Given 05/02/21 1442)  oxyCODONE-acetaminophen (PERCOCET/ROXICET) 5-325 MG per tablet 1 tablet (1 tablet Oral Given 05/02/21 1456)  oxyCODONE-acetaminophen (PERCOCET/ROXICET) 5-325 MG per tablet 1 tablet (1 tablet Oral Given 05/02/21 2006)  cefTRIAXone (ROCEPHIN) 1 g in sodium chloride 0.9 % 100 mL IVPB (0 g Intravenous Stopped 05/03/21 0543)    ED Course  I have reviewed the triage vital signs and the nursing notes.  Pertinent labs & imaging results that were available during my care of the patient were reviewed by me and considered in my medical decision making (see chart for details).    MDM Rules/Calculators/A&P                           Patient presents with flank pain, known nephrolithiasis and vomiting.  No fevers or chills.  No tachycardia.  No hypotension.  UA with questionable urinary tract infection including leukocytes but no nitrites.  Several white blood cells.  Patient given Rocephin here and will be discharged home with Keflex.  She will need close urology follow-up.  Urine culture sent.  Abdomen soft and nontender no CVA tenderness on my exam.  Discussed reasons return to the emergency department.  Patient and significant other state understanding and are in agreement with the plan.   Final Clinical Impression(s) / ED Diagnoses Final diagnoses:  Flank pain  Nephrolithiasis    Rx / DC Orders ED Discharge Orders          Ordered    cephALEXin (KEFLEX) 500 MG capsule  4 times daily        05/03/21 0545             Kiasha Bellin, Dahlia Client, PA-C 05/03/21 0630    Spenser Harren, Dahlia Client, PA-C 05/03/21 0645    Gilda Crease, MD 05/04/21 [phone]

## 2021-05-30 ENCOUNTER — Other Ambulatory Visit: Payer: Self-pay | Admitting: Family Medicine

## 2021-05-30 DIAGNOSIS — Z1231 Encounter for screening mammogram for malignant neoplasm of breast: Secondary | ICD-10-CM

## 2021-07-02 ENCOUNTER — Ambulatory Visit
Admission: RE | Admit: 2021-07-02 | Discharge: 2021-07-02 | Disposition: A | Discharge status: Home / Self Care | Payer: BLUE CROSS/BLUE SHIELD | Source: Ambulatory Visit | Attending: Family Medicine | Admitting: Family Medicine

## 2021-07-02 ENCOUNTER — Other Ambulatory Visit: Payer: Self-pay

## 2021-07-02 DIAGNOSIS — Z1231 Encounter for screening mammogram for malignant neoplasm of breast: Secondary | ICD-10-CM

## 2021-11-28 IMAGING — MG MM DIGITAL SCREENING BILAT W/ TOMO AND CAD
6 of 12 series · 6 of 36 positions shown · non-contrast
Comparison: Previous exam(s).

CLINICAL DATA: Screening.

EXAM:
DIGITAL SCREENING BILATERAL MAMMOGRAM WITH TOMOSYNTHESIS AND CAD
TECHNIQUE: Bilateral screening digital craniocaudal and mediolateral oblique
mammograms were obtained. Bilateral screening digital breast
tomosynthesis was performed. The images were evaluated with
computer-aided detection.

[R MLO synth-2D (1 of 2)]
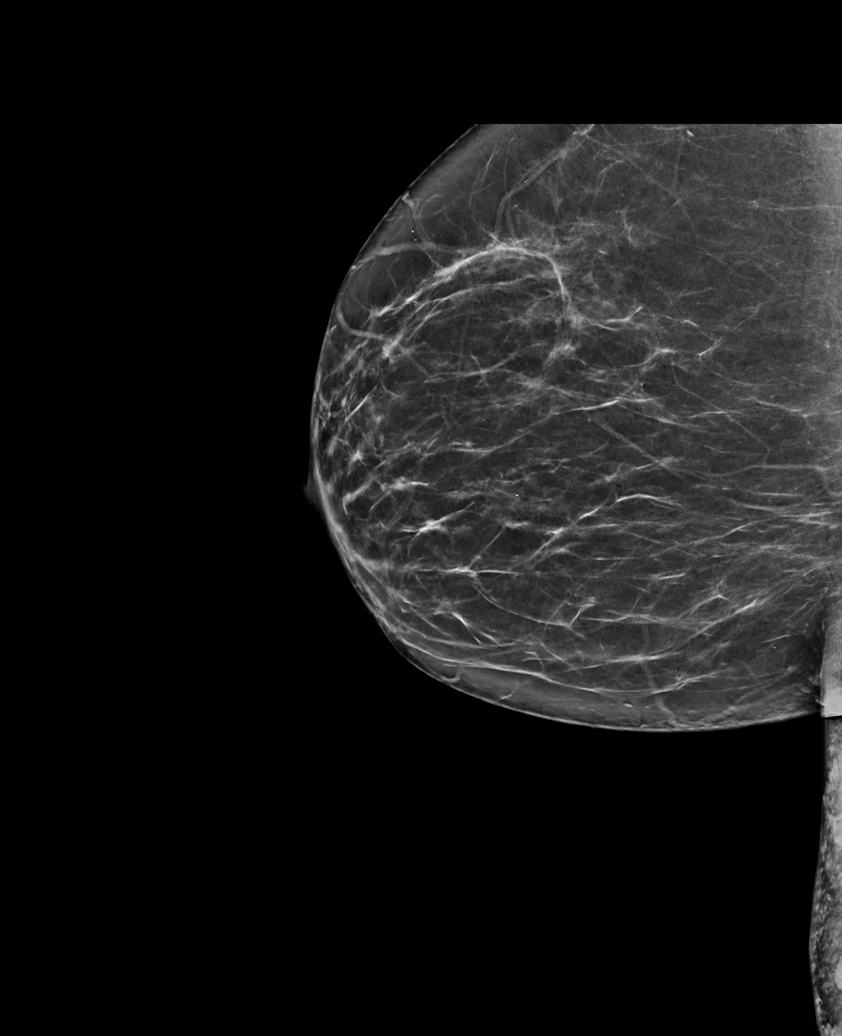

[L MLO synth-2D (1 of 2)]
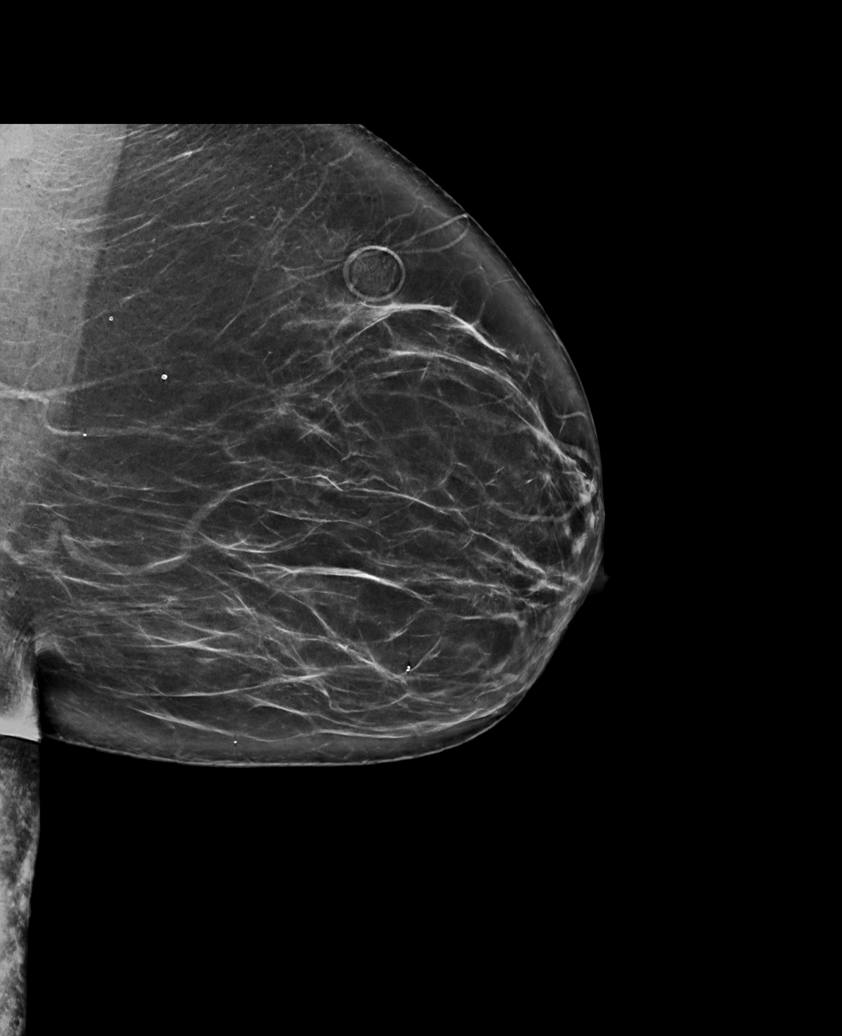

[R CC synth-2D]
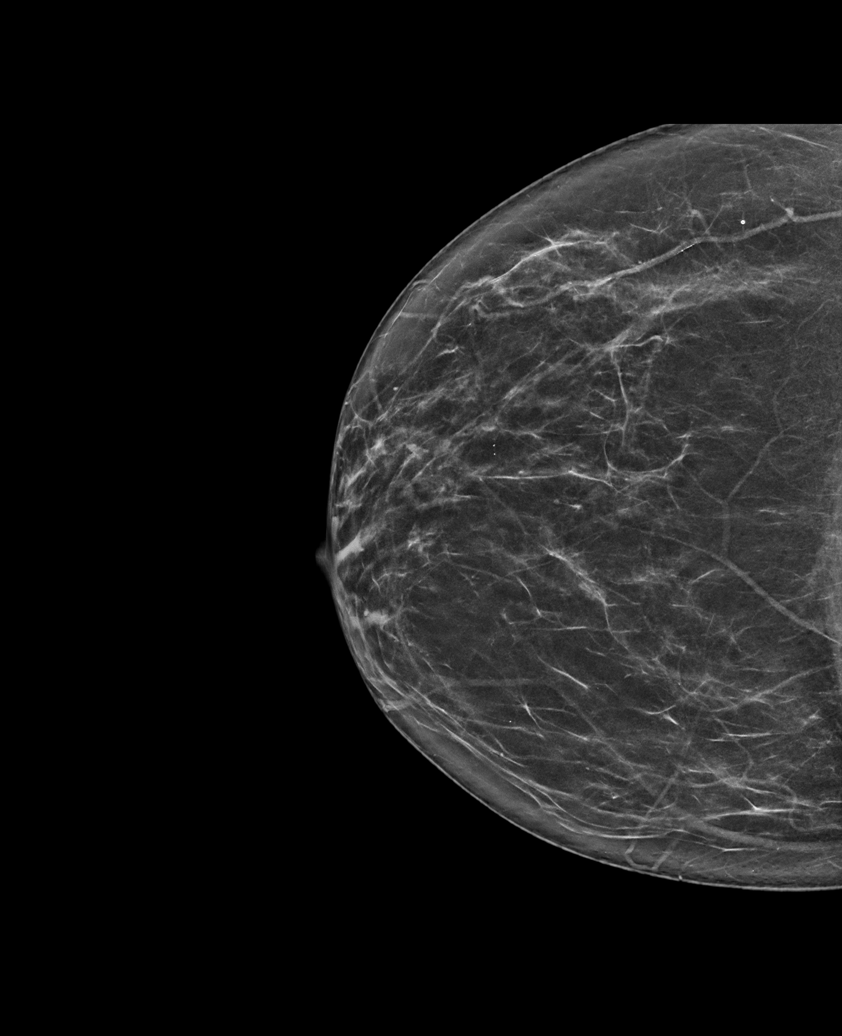

[R MLO synth-2D (2 of 2)]
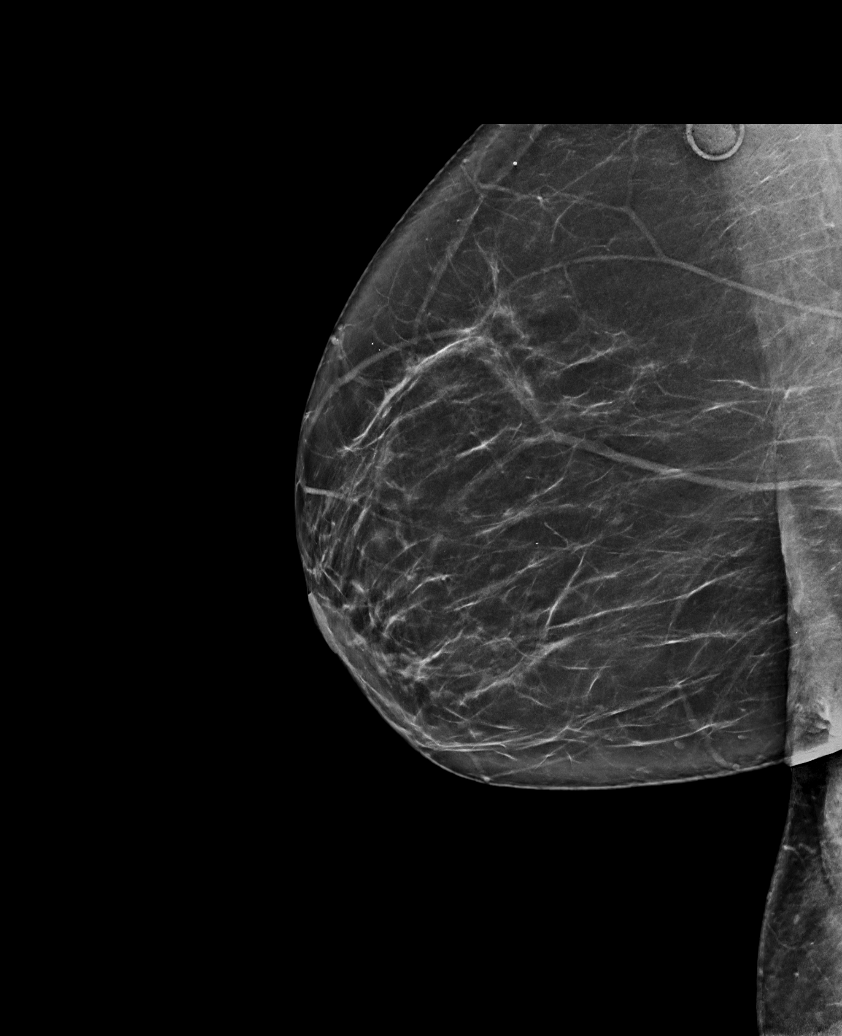

[L CC synth-2D]
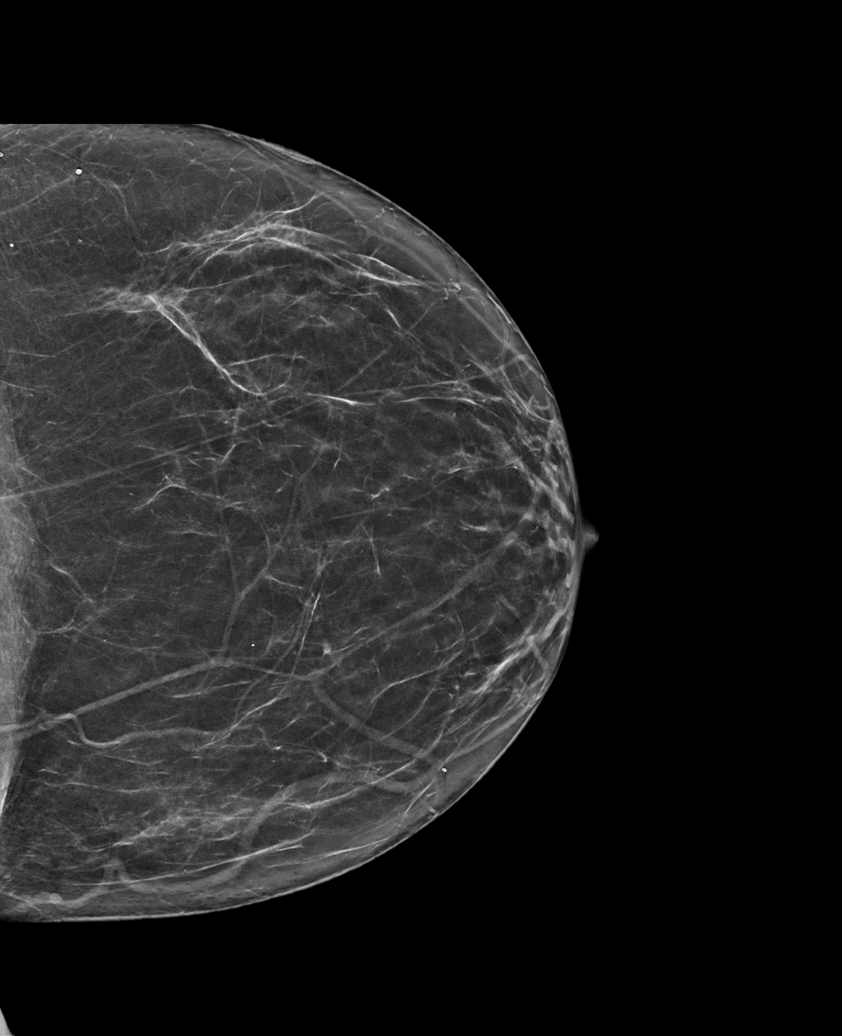

[L MLO synth-2D (2 of 2)]
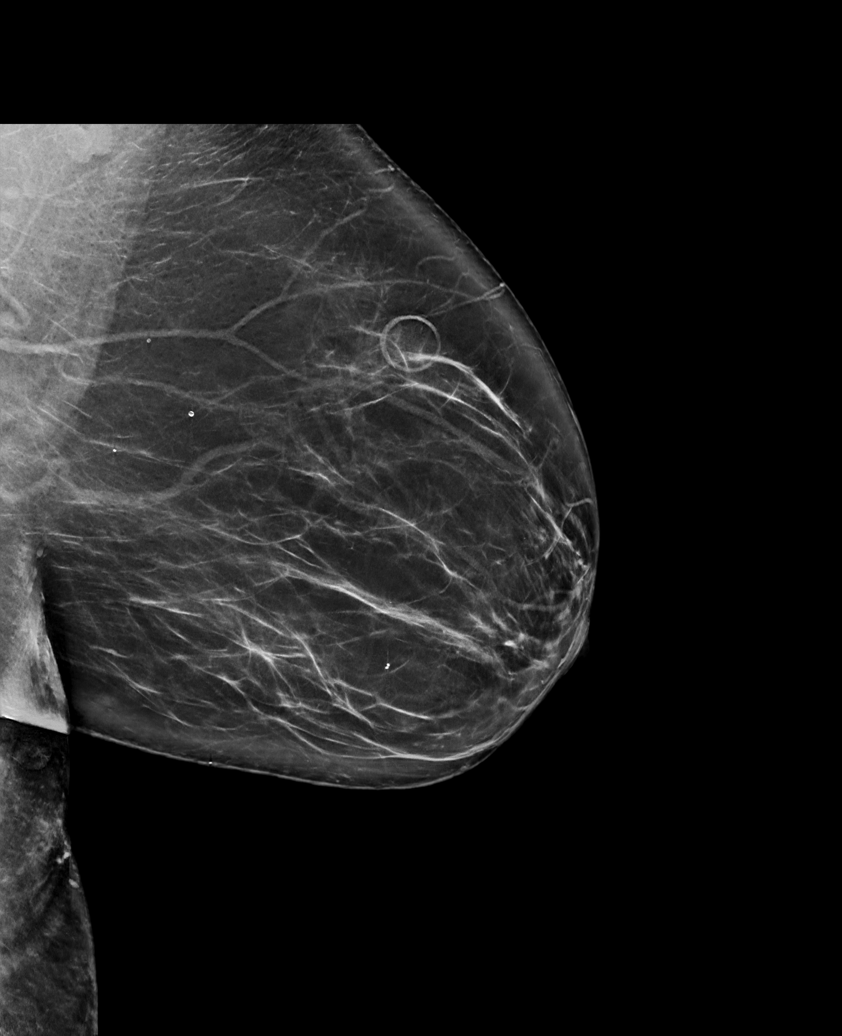

[6 of 36 positions shown; findings below may reference images not displayed]

ACR Breast Density Category b: There are scattered areas of
fibroglandular density.
FINDINGS: There are no findings suspicious for malignancy.
IMPRESSION: No mammographic evidence of malignancy. A result letter of this
screening mammogram will be mailed directly to the patient.

RECOMMENDATION:
Screening mammogram in one year. (Code:[id number]-O-LD2)

BI-RADS CATEGORY  1: Negative.

## 2022-08-09 ENCOUNTER — Other Ambulatory Visit: Payer: Self-pay

## 2022-08-09 DIAGNOSIS — Z1231 Encounter for screening mammogram for malignant neoplasm of breast: Secondary | ICD-10-CM

## 2022-09-30 ENCOUNTER — Ambulatory Visit
Admission: RE | Admit: 2022-09-30 | Discharge: 2022-09-30 | Disposition: A | Discharge status: Home / Self Care | Payer: Commercial Managed Care - PPO | Source: Ambulatory Visit | Attending: Family Medicine | Admitting: Family Medicine

## 2022-09-30 DIAGNOSIS — Z1231 Encounter for screening mammogram for malignant neoplasm of breast: Secondary | ICD-10-CM

## 2023-08-26 ENCOUNTER — Other Ambulatory Visit: Payer: Self-pay | Admitting: Family Medicine

## 2023-08-26 DIAGNOSIS — Z1231 Encounter for screening mammogram for malignant neoplasm of breast: Secondary | ICD-10-CM

## 2023-10-03 ENCOUNTER — Ambulatory Visit: Payer: Commercial Managed Care - PPO

## 2023-10-20 ENCOUNTER — Ambulatory Visit
Admission: RE | Admit: 2023-10-20 | Discharge: 2023-10-20 | Disposition: A | Discharge status: Home / Self Care | Payer: Commercial Managed Care - PPO | Source: Ambulatory Visit | Attending: Family Medicine

## 2023-10-20 DIAGNOSIS — Z1231 Encounter for screening mammogram for malignant neoplasm of breast: Secondary | ICD-10-CM
